# Patient Record
Sex: Male | Born: 1985
Health system: Southern US, Community
[De-identification: ages and names within clinical notes are randomized; demographics above are authoritative.]

## PROBLEM LIST (undated history)

## (undated) DIAGNOSIS — F411 Generalized anxiety disorder: Secondary | ICD-10-CM

## (undated) DIAGNOSIS — E785 Hyperlipidemia, unspecified: Secondary | ICD-10-CM

## (undated) DIAGNOSIS — E781 Pure hyperglyceridemia: Secondary | ICD-10-CM

## (undated) DIAGNOSIS — Z9189 Other specified personal risk factors, not elsewhere classified: Secondary | ICD-10-CM

## (undated) DIAGNOSIS — F319 Bipolar disorder, unspecified: Secondary | ICD-10-CM

## (undated) DIAGNOSIS — IMO0001 Reserved for inherently not codable concepts without codable children: Secondary | ICD-10-CM

## (undated) DIAGNOSIS — F329 Major depressive disorder, single episode, unspecified: Secondary | ICD-10-CM

## (undated) DIAGNOSIS — M4120 Other idiopathic scoliosis, site unspecified: Secondary | ICD-10-CM

## (undated) HISTORY — DX: Other specified personal risk factors, not elsewhere classified: Z91.89

## (undated) HISTORY — DX: Hyperlipidemia, unspecified: E78.5

## (undated) HISTORY — DX: Other idiopathic scoliosis, site unspecified: M41.20

## (undated) HISTORY — DX: Pure hyperglyceridemia: E78.1

## (undated) HISTORY — DX: Reserved for inherently not codable concepts without codable children: IMO0001

## (undated) HISTORY — DX: Generalized anxiety disorder: F41.1

## (undated) HISTORY — PX: WISDOM TOOTH EXTRACTION: SHX21

## (undated) HISTORY — DX: Major depressive disorder, single episode, unspecified: F32.9

---
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
## 2005-05-27 ENCOUNTER — Emergency Department (HOSPITAL_COMMUNITY): Admission: EM | Admit: 2005-05-27 | Discharge: 2005-05-27 | Payer: Self-pay | Admitting: Emergency Medicine

## 2005-08-29 ENCOUNTER — Ambulatory Visit: Payer: Self-pay | Admitting: Internal Medicine

## 2005-09-01 ENCOUNTER — Ambulatory Visit: Payer: Self-pay | Admitting: Internal Medicine

## 2005-10-04 ENCOUNTER — Ambulatory Visit: Payer: Self-pay | Admitting: Internal Medicine

## 2005-10-05 ENCOUNTER — Ambulatory Visit: Payer: Self-pay | Admitting: Internal Medicine

## 2005-11-07 ENCOUNTER — Ambulatory Visit (HOSPITAL_COMMUNITY): Admission: RE | Admit: 2005-11-07 | Discharge: 2005-11-07 | Payer: Self-pay | Admitting: Orthopedic Surgery

## 2005-11-24 ENCOUNTER — Encounter: Admission: RE | Admit: 2005-11-24 | Discharge: 2005-11-24 | Payer: Self-pay | Admitting: Orthopedic Surgery

## 2006-08-22 ENCOUNTER — Emergency Department (HOSPITAL_COMMUNITY): Admission: EM | Admit: 2006-08-22 | Discharge: 2006-08-23 | Payer: Self-pay | Admitting: *Deleted

## 2007-02-16 ENCOUNTER — Encounter: Payer: Self-pay | Admitting: *Deleted

## 2007-02-16 DIAGNOSIS — Z9189 Other specified personal risk factors, not elsewhere classified: Secondary | ICD-10-CM | POA: Insufficient documentation

## 2007-02-16 DIAGNOSIS — M4120 Other idiopathic scoliosis, site unspecified: Secondary | ICD-10-CM | POA: Insufficient documentation

## 2007-02-16 HISTORY — DX: Other specified personal risk factors, not elsewhere classified: Z91.89

## 2007-02-16 HISTORY — DX: Other idiopathic scoliosis, site unspecified: M41.20

## 2007-12-27 ENCOUNTER — Encounter: Payer: Self-pay | Admitting: Internal Medicine

## 2008-08-05 ENCOUNTER — Encounter: Payer: Self-pay | Admitting: Internal Medicine

## 2009-02-20 ENCOUNTER — Encounter: Payer: Self-pay | Admitting: Internal Medicine

## 2010-04-13 ENCOUNTER — Encounter: Payer: Self-pay | Admitting: Internal Medicine

## 2010-06-02 ENCOUNTER — Ambulatory Visit: Admit: 2010-06-02 | Payer: Self-pay | Admitting: Internal Medicine

## 2010-06-10 NOTE — Letter (Signed)
Summary: [facility]  [facility]   Imported By: Sherian Rein 04/26/2010 11:44:45  _____________________________________________________________________  External Attachment:    Type:   Image     Comment:   External Document

## 2010-06-11 ENCOUNTER — Encounter (INDEPENDENT_AMBULATORY_CARE_PROVIDER_SITE_OTHER): Payer: Self-pay | Admitting: *Deleted

## 2010-06-11 ENCOUNTER — Other Ambulatory Visit: Payer: Self-pay | Admitting: Internal Medicine

## 2010-06-11 ENCOUNTER — Other Ambulatory Visit: Payer: Self-pay

## 2010-06-11 DIAGNOSIS — Z0000 Encounter for general adult medical examination without abnormal findings: Secondary | ICD-10-CM

## 2010-06-11 DIAGNOSIS — E785 Hyperlipidemia, unspecified: Secondary | ICD-10-CM

## 2010-06-11 LAB — URINALYSIS, ROUTINE W REFLEX MICROSCOPIC
Leukocytes, UA: NEGATIVE
Urine Glucose: NEGATIVE
Urobilinogen, UA: 0.2 (ref 0.0–1.0)

## 2010-06-11 LAB — LIPID PANEL
Cholesterol: 236 mg/dL — ABNORMAL HIGH (ref 0–200)
HDL: 31.2 mg/dL — ABNORMAL LOW (ref >39.00)
Total CHOL/HDL Ratio: 8
Triglycerides: 627 mg/dL — ABNORMAL HIGH (ref 0.0–149.0)
VLDL: 125.4 mg/dL — ABNORMAL HIGH (ref 0.0–40.0)

## 2010-06-11 LAB — HEPATIC FUNCTION PANEL
Albumin: 3.9 g/dL (ref 3.5–5.2)
Alkaline Phosphatase: 72 U/L (ref 39–117)
Total Protein: 6.6 g/dL (ref 6.0–8.3)

## 2010-06-11 LAB — CBC WITH DIFFERENTIAL/PLATELET
Basophils Absolute: 0 10*3/uL (ref 0.0–0.1)
Basophils Relative: 0.4 % (ref 0.0–3.0)
Eosinophils Absolute: 0.2 10*3/uL (ref 0.0–0.7)
HCT: 41.2 % (ref 39.0–52.0)
Hemoglobin: 14.4 g/dL (ref 13.0–17.0)
Lymphocytes Relative: 32.8 % (ref 12.0–46.0)
Lymphs Abs: 3.4 10*3/uL (ref 0.7–4.0)
MCHC: 34.8 g/dL (ref 30.0–36.0)
MCV: 90.9 fl (ref 78.0–100.0)
Monocytes Absolute: 0.8 10*3/uL (ref 0.1–1.0)
Neutro Abs: 5.9 10*3/uL (ref 1.4–7.7)
RBC: 4.54 Mil/uL (ref 4.22–5.81)
RDW: 12.7 % (ref 11.5–14.6)

## 2010-06-11 LAB — BASIC METABOLIC PANEL
BUN: 14 mg/dL (ref 6–23)
CO2: 31 mEq/L (ref 19–32)
Calcium: 9.4 mg/dL (ref 8.4–10.5)
Chloride: 101 mEq/L (ref 96–112)
Creatinine, Ser: 0.8 mg/dL (ref 0.4–1.5)
GFR: 124.79 mL/min (ref >60.00)
Glucose, Bld: 81 mg/dL (ref 70–99)
Potassium: 4.4 mEq/L (ref 3.5–5.1)
Sodium: 139 mEq/L (ref 135–145)

## 2010-06-11 LAB — TSH: TSH: 0.5 u[IU]/mL (ref 0.35–5.50)

## 2010-06-11 LAB — LDL CHOLESTEROL, DIRECT: Direct LDL: 127.4 mg/dL

## 2010-06-16 ENCOUNTER — Encounter (INDEPENDENT_AMBULATORY_CARE_PROVIDER_SITE_OTHER): Payer: Commercial Managed Care - PPO | Admitting: Internal Medicine

## 2010-06-16 ENCOUNTER — Encounter: Payer: Self-pay | Admitting: Internal Medicine

## 2010-06-16 DIAGNOSIS — E785 Hyperlipidemia, unspecified: Secondary | ICD-10-CM | POA: Insufficient documentation

## 2010-06-16 DIAGNOSIS — F411 Generalized anxiety disorder: Secondary | ICD-10-CM | POA: Insufficient documentation

## 2010-06-16 DIAGNOSIS — IMO0001 Reserved for inherently not codable concepts without codable children: Secondary | ICD-10-CM

## 2010-06-16 DIAGNOSIS — E781 Pure hyperglyceridemia: Secondary | ICD-10-CM

## 2010-06-16 DIAGNOSIS — F329 Major depressive disorder, single episode, unspecified: Secondary | ICD-10-CM | POA: Insufficient documentation

## 2010-06-16 DIAGNOSIS — F3289 Other specified depressive episodes: Secondary | ICD-10-CM

## 2010-06-16 DIAGNOSIS — Z23 Encounter for immunization: Secondary | ICD-10-CM

## 2010-06-16 DIAGNOSIS — Z0000 Encounter for general adult medical examination without abnormal findings: Secondary | ICD-10-CM

## 2010-06-16 HISTORY — DX: Generalized anxiety disorder: F41.1

## 2010-06-16 HISTORY — DX: Major depressive disorder, single episode, unspecified: F32.9

## 2010-06-16 HISTORY — DX: Reserved for inherently not codable concepts without codable children: IMO0001

## 2010-06-16 HISTORY — DX: Pure hyperglyceridemia: E78.1

## 2010-06-16 HISTORY — DX: Hyperlipidemia, unspecified: E78.5

## 2010-06-16 HISTORY — DX: Other specified depressive episodes: F32.89

## 2010-06-24 NOTE — Assessment & Plan Note (Signed)
Summary: CPX/BCBS/#CD   Vital Signs:  Patient profile:   25 year old male Height:      67 inches Weight:      226.13 pounds BMI:     35.54 O2 Sat:      94 % on Room air Temp:     98.5 degrees F oral Pulse rate:   113 / minute BP sitting:   112 / 90  (left arm) Cuff size:   large  Vitals Entered By: Zella Ball Ewing CMA (AAMA) (June 16, 2010 10:37 AM)  O2 Flow:  Room air  CC: Adult Physical/RE   CC:  Adult Physical/RE.  History of Present Illness: here for wellness and f/u - overall doing ok - Pt denies CP, worsening sob, doe, wheezing, orthopnea, pnd, worsening LE edema, palps, dizziness or syncope  Pt denies new neuro symptoms such as headache, facial or extremity weakness  Pt denies polydipsia, polyuria,   Overall good compliance with meds, trying to follow low chol diet, wt increased over 20 lbs, goes to gym 3 -4 days per wk but hard to lose wt.  No fever, wt loss, night sweats, loss of appetite or other constitutional symptoms  Overall good compliance with meds, and good tolerability.  Denies worsening depressive symptoms, suicidal ideation, or panic, though has ongoing significant anxiety.  Pt states good ability with ADL's, low fall risk, home safety reviewed and adequate, no significant change in hearing or vision, trying to follow lower chol diet, and occasionally active only with regular excercise.    Preventive Screening-Counseling & Management  Alcohol-Tobacco     Smoking Status: quit      Drug Use:  no.    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 1 By Mouth Once Daily 2)  Flexeril 10 Mg Tabs (Cyclobenzaprine Hcl) .Marland Kitchen.. 1 By Mouth Once Daily 3)  Ambien 10 Mg Tabs (Zolpidem Tartrate) .Marland Kitchen..  1 By Mouth At Bedtime 4)  Xanax 0.5 Mg Tabs (Alprazolam) .Marland Kitchen.. 1 By Mouth As Needed 5)  Robaxin 500 Mg Tabs (Methocarbamol) .Marland Kitchen.. 1 By Mouth Once Daily As Needed 6)  Prilosec 20 Mg Cpdr (Omeprazole) .Marland Kitchen.. 1 By Mouth Once Daily 7)  Multivitamins  Caps (Multiple Vitamin) .Marland Kitchen.. 1 By Mouth Once Daily 8)  Vitamin C 1000 Mg Tabs (Ascorbic Acid) .Marland Kitchen.. 1 By Mouth Once Daily 9)  Calcium-Vitamin D 600-200 Mg-Unit Tabs (Calcium-Vitamin D) .Marland Kitchen.. 1 By Mouth Once Daily 10)  Claritin 10 Mg Tabs (Loratadine) .Marland Kitchen.. 1 By Mouth Once Daily 11)  Fish Oil 1000 Mg Caps (Omega-3 Fatty Acids) .Marland Kitchen.. 1 By Mouth Once Daily 12)  Fenofibrate 160 Mg Tabs (Fenofibrate) .Marland Kitchen.. 1po Once Daily  Allergies (verified): No Known Drug Allergies  Past History:  Family History: Last updated: 06/16/2010 HTN Anxiety  Social History: Last updated: 06/16/2010 Single Former Smoker Alcohol use-no Drug use-no  Risk Factors: Smoking Status: quit (06/16/2010)  Past Medical History: WISDOM TEETH EXTRACTION, HX OF (ICD-V15.9) SCOLIOSIS, MILD (ICD-737.30 FMS Anxiety Depression Hyperlipidemia  Past Surgical History: Denies surgical history  Family History: Reviewed history and no changes required. HTN Anxiety  Social History: Reviewed history and no changes required. Single Former Smoker Alcohol use-no Drug use-no  Smoking Status:  quit Drug  Use:  no  Review of Systems  The patient denies anorexia, fever, weight loss, vision loss, decreased hearing, hoarseness, chest pain, syncope, dyspnea on exertion, peripheral edema, prolonged cough, headaches, hemoptysis, abdominal pain, melena, hematochezia, severe indigestion/heartburn, hematuria, muscle weakness, suspicious skin lesions, transient blindness, difficulty walking, depression, unusual weight change, abnormal bleeding, enlarged lymph nodes, and angioedema.         all otherwise negative per pt -    Physical Exam  General:  alert and overweight-appearing.   Head:   normocephalic and atraumatic.   Eyes:  vision grossly intact, pupils equal, and pupils round.   Ears:  R ear normal and L ear normal.   Nose:  no external deformity and no nasal discharge.   Mouth:  no gingival abnormalities and pharynx pink and moist.   Neck:  supple and no masses.   Lungs:  normal respiratory effort and normal breath sounds.   Heart:  normal rate and regular rhythm.   Abdomen:  soft, non-tender, and normal bowel sounds.   Msk:  no joint tenderness and no joint swelling.   Extremities:  no edema, no erythema  Neurologic:  cranial nerves II-XII intact and strength normal in all extremities.   Skin:  color normal and no rashes.   Psych:  normally interactive, not depressed appearing, and moderately anxious.     Impression & Recommendations:  Problem # 1:  Preventive Health Care (ICD-V70.0) Overall doing well, age appropriate education and counseling updated, referral for preventive services and immunizations addressed, dietary counseling and smoking status adressed , most recent labs reviewed I have personally reviewed and have noted 1.The patient's medical and social history 2.Their use of alcohol, tobacco or illicit drugs 3.Their current medications and supplements 4. Functional ability including ADL's, fall risk, home safety risk, hearing & visual impairment  5.Diet and physical activities 6.Evidence for depression or mood disorders The patients weight, height, BMI  have been recorded in the chart I have made referrals, counseling and provided education to the patient based review of the above   Problem # 2:  HYPERTRIGLYCERIDEMIA (ICD-272.1)  His updated medication list for this problem includes:    Fenofibrate 160 Mg Tabs (Fenofibrate) .Marland Kitchen... 1po once daily treat as above, f/u any worsening signs or symptoms   Complete Medication List: 1)  Cymbalta 60 Mg Cpep (Duloxetine hcl) .Marland Kitchen.. 1 by mouth once daily 2)  Flexeril 10 Mg Tabs (Cyclobenzaprine hcl) .Marland Kitchen.. 1 by  mouth once daily 3)  Ambien 10 Mg Tabs (Zolpidem tartrate) .Marland Kitchen.. 1 by mouth at bedtime 4)  Xanax 0.5 Mg Tabs (Alprazolam) .Marland Kitchen.. 1 by mouth as needed 5)  Robaxin 500 Mg Tabs (Methocarbamol) .Marland Kitchen.. 1 by mouth once daily as needed 6)  Prilosec 20 Mg Cpdr (Omeprazole) .Marland Kitchen.. 1 by mouth once daily 7)  Multivitamins Caps (Multiple vitamin) .Marland Kitchen.. 1 by mouth once daily 8)  Vitamin C 1000 Mg Tabs (Ascorbic acid) .Marland Kitchen.. 1 by mouth once daily 9)  Calcium-vitamin D 600-200 Mg-unit Tabs (Calcium-vitamin d) .Marland Kitchen.. 1 by mouth once daily 10)  Claritin 10 Mg Tabs (Loratadine) .Marland Kitchen.. 1 by mouth once daily 11)  Fish Oil 1000 Mg Caps (Omega-3 fatty acids) .Marland Kitchen.. 1 by mouth once daily 12)  Fenofibrate 160 Mg Tabs (Fenofibrate) .Marland Kitchen.. 1po once daily  Other Orders: Tdap => 17yrs IM (16109) Admin 1st Vaccine (60454)  Patient Instructions: 1)  start fenofibrate 160 mg per day 2)  you had the tetanus shot today 3)  Please follow lower cholesterol, lower fat diet,  and get regular excercise 4)  Please schedule a follow-up appointment in 1 year, or sooner if needed Prescriptions: FENOFIBRATE 160 MG TABS (FENOFIBRATE) 1po once daily  #90 x 3   Entered and Authorized by:   Corwin Levins MD   Signed by:   Corwin Levins MD on 06/16/2010   Method used:   Print then Give to Patient   RxID:   [id number]    Orders Added: 1)  Tdap => 91yrs IM [[id number]] 2)  Admin 1st Vaccine [90471] 3)  Est. Patient 18-39 years [46962]   Immunizations Administered:  Tetanus Vaccine:    Vaccine Type: Tdap    Site: left deltoid    Mfr: GlaxoSmithKline    Dose: 0.5 ml    Route: IM    Given by: Zella Ball Ewing CMA (AAMA)    Exp. Date: 02/26/2012    Lot #: [id number]    VIS given: 03/26/08 version given June 16, 2010.   Immunizations Administered:  Tetanus Vaccine:    Vaccine Type: Tdap    Site: left deltoid    Mfr: GlaxoSmithKline    Dose: 0.5 ml    Route: IM    Given by: Zella Ball Ewing CMA (AAMA)    Exp. Date: 02/26/2012    Lot #:  [id number]    VIS given: 03/26/08 version given June 16, 2010.

## 2011-06-06 ENCOUNTER — Telehealth: Payer: Self-pay

## 2011-06-06 DIAGNOSIS — Z0000 Encounter for general adult medical examination without abnormal findings: Secondary | ICD-10-CM

## 2011-06-06 NOTE — Telephone Encounter (Signed)
Put order in for physical labs. 

## 2011-06-20 ENCOUNTER — Other Ambulatory Visit: Payer: Self-pay | Admitting: Internal Medicine

## 2011-07-07 ENCOUNTER — Other Ambulatory Visit (INDEPENDENT_AMBULATORY_CARE_PROVIDER_SITE_OTHER): Payer: Commercial Managed Care - PPO

## 2011-07-07 DIAGNOSIS — Z0000 Encounter for general adult medical examination without abnormal findings: Secondary | ICD-10-CM

## 2011-07-07 LAB — CBC WITH DIFFERENTIAL/PLATELET
Basophils Relative: 0.5 % (ref 0.0–3.0)
Eosinophils Absolute: 0.1 10*3/uL (ref 0.0–0.7)
Eosinophils Relative: 1.8 % (ref 0.0–5.0)
Hemoglobin: 14.2 g/dL (ref 13.0–17.0)
Lymphocytes Relative: 33.4 % (ref 12.0–46.0)
MCHC: 33.8 g/dL (ref 30.0–36.0)
MCV: 92 fl (ref 78.0–100.0)
Monocytes Absolute: 0.6 10*3/uL (ref 0.1–1.0)
Neutro Abs: 4.3 10*3/uL (ref 1.4–7.7)
Neutrophils Relative %: 56.2 % (ref 43.0–77.0)
RBC: 4.57 Mil/uL (ref 4.22–5.81)
WBC: 7.7 10*3/uL (ref 4.5–10.5)

## 2011-07-07 LAB — BASIC METABOLIC PANEL
BUN: 16 mg/dL (ref 6–23)
Chloride: 104 mEq/L (ref 96–112)
Creatinine, Ser: 0.9 mg/dL (ref 0.4–1.5)
Glucose, Bld: 116 mg/dL — ABNORMAL HIGH (ref 70–99)
Potassium: 3.7 mEq/L (ref 3.5–5.1)

## 2011-07-07 LAB — URINALYSIS, ROUTINE W REFLEX MICROSCOPIC
Bilirubin Urine: NEGATIVE
Ketones, ur: NEGATIVE
Leukocytes, UA: NEGATIVE
Nitrite: NEGATIVE
Urobilinogen, UA: 0.2 (ref 0.0–1.0)

## 2011-07-07 LAB — HEPATIC FUNCTION PANEL
ALT: 38 U/L (ref 0–53)
AST: 24 U/L (ref 0–37)
Albumin: 4.6 g/dL (ref 3.5–5.2)
Total Bilirubin: 0.4 mg/dL (ref 0.3–1.2)

## 2011-07-07 LAB — LDL CHOLESTEROL, DIRECT: Direct LDL: 162.5 mg/dL

## 2011-07-07 LAB — LIPID PANEL
Total CHOL/HDL Ratio: 5
Triglycerides: 136 mg/dL (ref 0.0–149.0)

## 2011-07-07 LAB — TSH: TSH: 0.42 u[IU]/mL (ref 0.35–5.50)

## 2011-07-08 ENCOUNTER — Encounter: Payer: Self-pay | Admitting: Internal Medicine

## 2011-07-08 DIAGNOSIS — Z139 Encounter for screening, unspecified: Secondary | ICD-10-CM | POA: Insufficient documentation

## 2011-07-08 DIAGNOSIS — Z0000 Encounter for general adult medical examination without abnormal findings: Secondary | ICD-10-CM | POA: Insufficient documentation

## 2011-07-08 LAB — HEMOGLOBIN A1C: Hgb A1c MFr Bld: 5.6 % (ref 4.6–6.5)

## 2011-07-14 ENCOUNTER — Ambulatory Visit (INDEPENDENT_AMBULATORY_CARE_PROVIDER_SITE_OTHER): Payer: Commercial Managed Care - PPO | Admitting: Internal Medicine

## 2011-07-14 ENCOUNTER — Encounter: Payer: Self-pay | Admitting: Internal Medicine

## 2011-07-14 VITALS — BP 110/82 | HR 110 | Temp 98.4°F | Ht 67.0 in | Wt 219.5 lb

## 2011-07-14 DIAGNOSIS — R7302 Impaired glucose tolerance (oral): Secondary | ICD-10-CM | POA: Insufficient documentation

## 2011-07-14 DIAGNOSIS — R7309 Other abnormal glucose: Secondary | ICD-10-CM

## 2011-07-14 DIAGNOSIS — Z0000 Encounter for general adult medical examination without abnormal findings: Secondary | ICD-10-CM

## 2011-07-14 HISTORY — DX: Impaired glucose tolerance (oral): R73.02

## 2011-07-14 NOTE — Progress Notes (Signed)
Subjective:    Patient ID: Corey Smith, male    DOB: 1986/01/25, 26 y.o.   MRN: [id number]  HPI  Here for wellness and f/u;  Overall doing ok;  Pt denies CP, worsening SOB, DOE, wheezing, orthopnea, PND, worsening LE edema, palpitations, dizziness or syncope.  Pt denies neurological change such as new Headache, facial or extremity weakness.  Pt denies polydipsia, polyuria, or low sugar symptoms. Pt states overall good compliance with treatment and medications, good tolerability, and trying to follow lower cholesterol diet.  Pt denies worsening depressive symptoms, suicidal ideation or panic. No fever, wt loss, night sweats, loss of appetite, or other constitutional symptoms.  Pt states good ability with ADL's, low fall risk, home safety reviewed and adequate, no significant changes in hearing or vision, and occasionally active with exercise.  Works as Public house manager for [employer]  Cont's to see dr Devweschwar/rheum for his FMS Past Medical History  Diagnosis Date  . ANXIETY 06/16/2010  . DEPRESSION 06/16/2010  . HYPERLIPIDEMIA 06/16/2010  . FIBROMYALGIA 06/16/2010  . HYPERTRIGLYCERIDEMIA 06/16/2010  . SCOLIOSIS, MILD 02/16/2007  . WISDOM TEETH EXTRACTION, HX OF 02/16/2007   No past surgical history on file.  reports that he has quit smoking. He does not have any smokeless tobacco history on file. He reports that he does not drink alcohol or use illicit drugs. family history includes Anxiety disorder in his other and Hypertension in his other. No Known Allergies Current Outpatient Prescriptions on File Prior to Visit  Medication Sig Dispense Refill  . ALPRAZolam (XANAX) 0.5 MG tablet Take 0.5 mg by mouth at bedtime as needed.      . Ascorbic Acid (VITAMIN C) 1000 MG tablet Take 1,000 mg by mouth daily.      . Calcium Carbonate-Vitamin D (CALCIUM 600+D) 600-200 MG-UNIT TABS Take 1 tablet by mouth daily.      . cyclobenzaprine (FLEXERIL) 10 MG tablet Take 10 mg by mouth daily.      . DULoxetine (CYMBALTA) 60 MG  capsule Take 60 mg by mouth daily.      . fenofibrate 160 MG tablet TAKE 1 TABLET BY MOUTH ONCE DAILY  90 tablet  1  . loratadine (CLARITIN) 10 MG tablet Take 10 mg by mouth daily.      . methocarbamol (ROBAXIN) 500 MG tablet Take 500 mg by mouth daily as needed.      . Multiple Vitamin (MULTIVITAMIN) capsule Take 1 capsule by mouth daily.      Marland Kitchen omeprazole (PRILOSEC) 20 MG capsule Take 20 mg by mouth daily.      Marland Kitchen zolpidem (AMBIEN) 10 MG tablet Take 10 mg by mouth at bedtime.      . fish oil-omega-3 fatty acids 1000 MG capsule Take 1 capsule by mouth daily.         Review of Systems Review of Systems  Constitutional: Negative for diaphoresis, activity change, appetite change and unexpected weight change.  HENT: Negative for hearing loss, ear pain, facial swelling, mouth sores and neck stiffness.   Eyes: Negative for pain, redness and visual disturbance.  Respiratory: Negative for shortness of breath and wheezing.   Cardiovascular: Negative for chest pain and palpitations.  Gastrointestinal: Negative for diarrhea, blood in stool, abdominal distention and rectal pain.  Genitourinary: Negative for hematuria, flank pain and decreased urine volume.  Musculoskeletal: Negative for myalgias and joint swelling.  Skin: Negative for color change and wound.  Neurological: Negative for syncope and numbness.  Hematological: Negative for adenopathy.  Psychiatric/Behavioral: Negative  for hallucinations, self-injury, decreased concentration and agitation.      Objective:   Physical Exam BP 110/82  Pulse 110  Temp(Src) 98.4 F (36.9 C) (Oral)  Ht 5\' 7"  (1.702 m)  Wt 219 lb 8 oz (99.565 kg)  BMI 34.38 kg/m2  SpO2 97% Physical Exam  VS noted Constitutional: Pt is oriented to person, place, and time. Appears well-developed and well-nourished.  HENT:  Head: Normocephalic and atraumatic.  Right Ear: External ear normal.  Left Ear: External ear normal.  Nose: Nose normal.  Mouth/Throat:  Oropharynx is clear and moist.  Eyes: Conjunctivae and EOM are normal. Pupils are equal, round, and reactive to light.  Neck: Normal range of motion. Neck supple. No JVD present. No tracheal deviation present.  Cardiovascular: Normal rate, regular rhythm, normal heart sounds and intact distal pulses.   Pulmonary/Chest: Effort normal and breath sounds normal.  Abdominal: Soft. Bowel sounds are normal. There is no tenderness.  Musculoskeletal: Normal range of motion. Exhibits no edema.  Lymphadenopathy:  Has no cervical adenopathy.  Neurological: Pt is alert and oriented to person, place, and time. Pt has normal reflexes. No cranial nerve deficit.  Skin: Skin is warm and dry. No rash noted.  Psychiatric:  Has  normal mood and affect. Behavior is normal.     Assessment & Plan:

## 2011-07-14 NOTE — Patient Instructions (Signed)
Continue all other medications as before Please have the pharmacy call if you need refills Please keep your appointments with your specialists as you have planned - Dr Link Snuffer Please focus on more activity and weight loss if possible Please return in 1 year for your yearly visit, or sooner if needed, with Lab testing done 3-5 days before

## 2011-07-17 ENCOUNTER — Encounter: Payer: Self-pay | Admitting: Internal Medicine

## 2011-07-17 NOTE — Assessment & Plan Note (Signed)

## 2011-07-17 NOTE — Assessment & Plan Note (Signed)
Asymtp, for f/u a1c next visit Lab Results  Component Value Date   HGBA1C 5.6 07/07/2011

## 2012-04-10 ENCOUNTER — Other Ambulatory Visit: Payer: Self-pay | Admitting: Internal Medicine

## 2012-07-11 ENCOUNTER — Other Ambulatory Visit (INDEPENDENT_AMBULATORY_CARE_PROVIDER_SITE_OTHER): Payer: Commercial Managed Care - PPO

## 2012-07-11 DIAGNOSIS — Z0000 Encounter for general adult medical examination without abnormal findings: Secondary | ICD-10-CM

## 2012-07-11 LAB — URINALYSIS, ROUTINE W REFLEX MICROSCOPIC
Bilirubin Urine: NEGATIVE
Leukocytes, UA: NEGATIVE
Nitrite: NEGATIVE
Specific Gravity, Urine: 1.015 (ref 1.000–1.030)
Total Protein, Urine: NEGATIVE
pH: 6.5 (ref 5.0–8.0)

## 2012-07-11 LAB — HEPATIC FUNCTION PANEL
ALT: 27 U/L (ref 0–53)
Albumin: 4.4 g/dL (ref 3.5–5.2)
Bilirubin, Direct: 0.1 mg/dL (ref 0.0–0.3)
Total Protein: 7.6 g/dL (ref 6.0–8.3)

## 2012-07-11 LAB — LIPID PANEL
Cholesterol: 203 mg/dL — ABNORMAL HIGH (ref 0–200)
VLDL: 26.2 mg/dL (ref 0.0–40.0)

## 2012-07-11 LAB — BASIC METABOLIC PANEL
BUN: 17 mg/dL (ref 6–23)
GFR: 110.02 mL/min (ref >60.00)
Potassium: 4.1 mEq/L (ref 3.5–5.1)
Sodium: 137 mEq/L (ref 135–145)

## 2012-07-11 LAB — TSH: TSH: 0.61 u[IU]/mL (ref 0.35–5.50)

## 2012-07-11 LAB — CBC WITH DIFFERENTIAL/PLATELET
Basophils Relative: 0.3 % (ref 0.0–3.0)
Eosinophils Relative: 2.4 % (ref 0.0–5.0)
Lymphocytes Relative: 24.4 % (ref 12.0–46.0)
Neutrophils Relative %: 65.3 % (ref 43.0–77.0)
Platelets: 354 10*3/uL (ref 150.0–400.0)
RBC: 4.57 Mil/uL (ref 4.22–5.81)
WBC: 10.5 10*3/uL (ref 4.5–10.5)

## 2012-07-16 ENCOUNTER — Encounter: Payer: Self-pay | Admitting: Internal Medicine

## 2012-07-16 ENCOUNTER — Ambulatory Visit (INDEPENDENT_AMBULATORY_CARE_PROVIDER_SITE_OTHER): Payer: Commercial Managed Care - PPO | Admitting: Internal Medicine

## 2012-07-16 VITALS — BP 102/78 | HR 110 | Temp 98.2°F | Ht 66.0 in | Wt 211.2 lb

## 2012-07-16 DIAGNOSIS — R7309 Other abnormal glucose: Secondary | ICD-10-CM

## 2012-07-16 DIAGNOSIS — Z0000 Encounter for general adult medical examination without abnormal findings: Secondary | ICD-10-CM

## 2012-07-16 DIAGNOSIS — R7302 Impaired glucose tolerance (oral): Secondary | ICD-10-CM

## 2012-07-16 DIAGNOSIS — M7660 Achilles tendinitis, unspecified leg: Secondary | ICD-10-CM | POA: Insufficient documentation

## 2012-07-16 DIAGNOSIS — M7662 Achilles tendinitis, left leg: Secondary | ICD-10-CM

## 2012-07-16 MED ORDER — FENOFIBRATE 160 MG PO TABS: ORAL_TABLET | ORAL | Status: DC

## 2012-07-16 NOTE — Assessment & Plan Note (Signed)
Mild, for alleve prn, to f/u any worsening symptoms or concerns

## 2012-07-16 NOTE — Assessment & Plan Note (Signed)
stable overall by history and exam, recent data reviewed with pt, and pt to continue medical treatment as before,  to f/u any worsening symptoms or concerns Lab Results  Component Value Date   HGBA1C 5.7 07/11/2012

## 2012-07-16 NOTE — Progress Notes (Signed)
Subjective:    Patient ID: Corey Smith, male    DOB: 1985/09/28, 28 y.o.   MRN: [id number]  HPI  Here for wellness and f/u;  Overall doing ok;  Pt denies CP, worsening SOB, DOE, wheezing, orthopnea, PND, worsening LE edema, palpitations, dizziness or syncope.  Pt denies neurological change such as new headache, facial or extremity weakness.  Pt denies polydipsia, polyuria, or low sugar symptoms. Pt states overall good compliance with treatment and medications, good tolerability, and has been trying to follow lower cholesterol diet.  Pt denies worsening depressive symptoms, suicidal ideation or panic. No fever, night sweats, wt loss, loss of appetite, or other constitutional symptoms.  Pt states good ability with ADL's, has low fall risk, home safety reviewed and adequate, no other significant changes in hearing or vision, and only occasionally active with exercise.  Does have a minor firm swelling area near the left achilles insertion site with mild tenderness Past Medical History  Diagnosis Date  . ANXIETY 06/16/2010  . DEPRESSION 06/16/2010  . HYPERLIPIDEMIA 06/16/2010  . FIBROMYALGIA 06/16/2010  . HYPERTRIGLYCERIDEMIA 06/16/2010  . SCOLIOSIS, MILD 02/16/2007  . WISDOM TEETH EXTRACTION, HX OF 02/16/2007   No past surgical history on file.  reports that he has quit smoking. He does not have any smokeless tobacco history on file. He reports that he does not drink alcohol or use illicit drugs. family history includes Anxiety disorder in his other and Hypertension in his other. No Known Allergies Current Outpatient Prescriptions on File Prior to Visit  Medication Sig Dispense Refill  . ALPRAZolam (XANAX) 0.5 MG tablet Take 0.5 mg by mouth at bedtime as needed.      . Calcium Carbonate-Vitamin D (CALCIUM 600+D) 600-200 MG-UNIT TABS Take 1 tablet by mouth daily.      . cyclobenzaprine (FLEXERIL) 10 MG tablet Take 10 mg by mouth daily.      . DULoxetine (CYMBALTA) 60 MG capsule Take 60 mg by mouth daily.       Marland Kitchen loratadine (CLARITIN) 10 MG tablet Take 10 mg by mouth daily.      . methocarbamol (ROBAXIN) 500 MG tablet Take 500 mg by mouth daily as needed.      . Multiple Vitamin (MULTIVITAMIN) capsule Take 1 capsule by mouth daily.      Marland Kitchen omeprazole (PRILOSEC) 20 MG capsule Take 20 mg by mouth daily.      Marland Kitchen zolpidem (AMBIEN) 10 MG tablet Take 10 mg by mouth at bedtime.       No current facility-administered medications on file prior to visit.   Review of Systems Constitutional: Negative for diaphoresis, activity change, appetite change or unexpected weight change.  HENT: Negative for hearing loss, ear pain, facial swelling, mouth sores and neck stiffness.   Eyes: Negative for pain, redness and visual disturbance.  Respiratory: Negative for shortness of breath and wheezing.   Cardiovascular: Negative for chest pain and palpitations.  Gastrointestinal: Negative for diarrhea, blood in stool, abdominal distention or other pain Genitourinary: Negative for hematuria, flank pain or change in urine volume.  Musculoskeletal: Negative for myalgias and joint swelling.  Skin: Negative for color change and wound.  Neurological: Negative for syncope and numbness. other than noted Hematological: Negative for adenopathy.  Psychiatric/Behavioral: Negative for hallucinations, self-injury, decreased concentration and agitation.      Objective:   Physical Exam BP 102/78  Pulse 110  Temp(Src) 98.2 F (36.8 C) (Oral)  Ht 5\' 6"  (1.676 m)  Wt 211 lb 4 oz (95.822 kg)  BMI 34.11 kg/m2  SpO2 93% VS noted,  Constitutional: Pt is oriented to person, place, and time. Appears well-developed and well-nourished.  Head: Normocephalic and atraumatic.  Right Ear: External ear normal.  Left Ear: External ear normal.  Nose: Nose normal.  Mouth/Throat: Oropharynx is clear and moist.  Eyes: Conjunctivae and EOM are normal. Pupils are equal, round, and reactive to light.  Neck: Normal range of motion. Neck supple. No  JVD present. No tracheal deviation present.  Cardiovascular: Normal rate, regular rhythm, normal heart sounds and intact distal pulses.   Pulmonary/Chest: Effort normal and breath sounds normal.  Abdominal: Soft. Bowel sounds are normal. There is no tenderness. No HSM  Musculoskeletal: Normal range of motion. Exhibits no edema.  Lymphadenopathy:  Has no cervical adenopathy.  Neurological: Pt is alert and oriented to person, place, and time. Pt has normal reflexes. No cranial nerve deficit.  Skin: Skin is warm and dry. No rash noted.  Psychiatric:  Has  normal mood and affect. Behavior is normal.  Left achilles with small tender nodular swelling near the left insertion site, mild tender       Assessment & Plan:

## 2012-07-16 NOTE — Patient Instructions (Addendum)
Please continue all other medications as before, and refills have been done if requested - the fenofibrate Please have the pharmacy call with any other refills you may need. Please continue your efforts at being more active, low cholesterol diet, and weight control. You are otherwise up to date with prevention measures today. Thank you for enrolling in MyChart. Please follow the instructions below to securely access your online medical record. MyChart allows you to send messages to your doctor, view your test results, renew your prescriptions, schedule appointments, and more To Log into My Chart online, please go by [facility] or [employer] to [email or web address], or download the MyChart App from the [employer] .  Your Username is: [email or web address] (pass andrea) Please send a practice Message on Mychart later today. Please return in 1 year for your yearly visit, or sooner if needed, with Lab testing done 3-5 days before

## 2012-07-16 NOTE — Assessment & Plan Note (Signed)

## 2013-03-14 ENCOUNTER — Other Ambulatory Visit: Payer: Self-pay

## 2013-07-16 ENCOUNTER — Encounter: Payer: Commercial Managed Care - PPO | Admitting: Internal Medicine

## 2013-07-16 DIAGNOSIS — Z0289 Encounter for other administrative examinations: Secondary | ICD-10-CM

## 2013-11-15 ENCOUNTER — Ambulatory Visit (INDEPENDENT_AMBULATORY_CARE_PROVIDER_SITE_OTHER): Payer: 59 | Admitting: Internal Medicine

## 2013-11-15 ENCOUNTER — Encounter: Payer: Self-pay | Admitting: Internal Medicine

## 2013-11-15 ENCOUNTER — Other Ambulatory Visit: Payer: Self-pay | Admitting: Internal Medicine

## 2013-11-15 VITALS — BP 118/74 | HR 93 | Temp 99.2°F | Resp 16 | Ht 67.0 in | Wt 188.0 lb

## 2013-11-15 DIAGNOSIS — M797 Fibromyalgia: Secondary | ICD-10-CM

## 2013-11-15 DIAGNOSIS — IMO0001 Reserved for inherently not codable concepts without codable children: Secondary | ICD-10-CM

## 2013-11-15 MED ORDER — PREGABALIN 50 MG PO CAPS: 50.0000 mg | ORAL_CAPSULE | Freq: Three times a day (TID) | ORAL | Status: DC

## 2013-11-15 MED ORDER — DULOXETINE HCL 60 MG PO CPEP: 60.0000 mg | ORAL_CAPSULE | Freq: Every day | ORAL | Status: DC

## 2013-11-15 NOTE — Progress Notes (Signed)
   Subjective:    Patient ID: Corey Smith, male    DOB: 21-Mar-1986, 28 y.o.   MRN: [id number]  HPI Pt presents today for management of his fibromyalgia. He was previously being followed by Dr. Kathe Marinerevashour for this problem. He last saw Dr. Kathe Marinerevashour in spring of 2014 at which time they were adjusting some of his medications. He had been on cymbalta and was trialing flexeril and xanax. Shortly after his visit with Dr. Donald Sivaevasour he lost his job and could no longer afford medications nor seeing a specialist. He states he used the flexeril, xanax and ambien until he ran out. He has continued to fill his cymbalta medication as it is generic. He has been on cymbalta alone for the past 6 months.   Today the pt's fibromyalgia is problematic. He has constant mid to lower back pain. The pain is also present in his hips and legs bilaterally. The pain is variant in nature. It is dull at times and sharp and burning at other times. He does have some tingling and numbness in his feet and hands at times.   Review of Systems  Constitutional: Negative for fever and chills.  Respiratory: Negative for shortness of breath.   Genitourinary: Negative for dysuria.       No loss of bowel or bladder  Musculoskeletal: Negative for joint swelling.      Objective:   Physical Exam sweating and mildly tachycardic  Negative SLRs      Assessment & Plan:

## 2013-11-15 NOTE — Telephone Encounter (Signed)
Patient would like refill on duloxetine 60 mg.  [facility].

## 2013-11-15 NOTE — Progress Notes (Signed)
   Subjective:    Patient ID: Corey Smith, male    DOB: 07-Oct-1985, 28 y.o.   MRN: [id number]  HPI He is here for followup of fibromyalgia. He previously saw a rheumatologist but has not seen her since Spring 2014 for financial issues.  He had been prescribed  Cymbalta ; a trial of muscle relaxants and Xanax.  Because he lost his job he wasn't able to afford the medications or see the specialist.  He had used  gabapentin in the past but did not find it particularly of value.  Over the last 6 months he has been on Cymbalta by itself  He has constant mid to lower back pain with pain in the hips and legs bilaterally.  The pain can vary from dull to sharp to burning.  He does have some tingling & numbness in his feet and hands intermittently.    Review of Systems  He denies fever, chills, sweats, weight loss.  No associated rashes .     Objective:   Physical Exam  Significant or distinguishing  findings on physical exam are documented first.  Below that are other systems examined & findings.  He has several tattoos. He also has ear posts bilaterally. jewelry. Head is shaven. He has a slight tachycardia at rest Skin slightly damp. There is increase in tone with range of motion testing of the lower extremities.  General appearance is one of good nourishment w/o distress. Eyes: No conjunctival inflammation or scleral icterus is present. Oral exam: Dental hygiene is good; lips and gums are healthy appearing.There is no oropharyngeal erythema or exudate noted.  Heart:  Normal rhythm. S1 and S2 normal without gallop, murmur, click, rub or other extra sounds   Lungs:Chest clear to auscultation; no wheezes, rhonchi,rales ,or rubs present.No increased work of breathing.  Abdomen: bowel sounds normal, soft and non-tender without masses, organomegaly or hernias noted.  No guarding or rebound .  Musculoskeletal: Able to lie flat and sit up without help. Negative straight leg raising  bilaterally. Gait normal Skin: Intact without suspicious lesions or rashes ; no jaundice or tenting Lymphatic: No lymphadenopathy is noted about the head, neck, axilla              Assessment & Plan:  #1 fibromyalgia  Plan: Sample of Lyrica with titration of dose if needed.  Water aerobics or graduated low impact exercise recommended.

## 2013-11-15 NOTE — Telephone Encounter (Signed)
OK X 3 months 

## 2013-11-15 NOTE — Patient Instructions (Signed)
Please assess your response to Lyrica

## 2013-11-15 NOTE — Progress Notes (Signed)
Pre visit review using our clinic review tool, if applicable. No additional management support is needed unless otherwise documented below in the visit note. 

## 2013-11-21 ENCOUNTER — Telehealth: Payer: Self-pay | Admitting: *Deleted

## 2013-11-21 DIAGNOSIS — M797 Fibromyalgia: Secondary | ICD-10-CM

## 2013-11-21 MED ORDER — PREGABALIN 50 MG PO CAPS: 50.0000 mg | ORAL_CAPSULE | Freq: Three times a day (TID) | ORAL | Status: DC

## 2013-11-21 NOTE — Telephone Encounter (Signed)
GREAT 3 month Rx with 1 refill

## 2013-11-21 NOTE — Telephone Encounter (Signed)
Pt wanted to let md know that the lyrica is working. Would like a rx sent to [facility]...Raechel Chute/lmb

## 2013-11-21 NOTE — Telephone Encounter (Signed)
Notified pt rx sent to [place].../lmb 

## 2014-01-24 ENCOUNTER — Encounter: Payer: Self-pay | Admitting: Internal Medicine

## 2014-01-24 ENCOUNTER — Ambulatory Visit (INDEPENDENT_AMBULATORY_CARE_PROVIDER_SITE_OTHER): Payer: 59 | Admitting: Internal Medicine

## 2014-01-24 VITALS — BP 132/90 | HR 105 | Temp 99.1°F | Ht 67.0 in | Wt 189.0 lb

## 2014-01-24 DIAGNOSIS — E785 Hyperlipidemia, unspecified: Secondary | ICD-10-CM

## 2014-01-24 DIAGNOSIS — Z23 Encounter for immunization: Secondary | ICD-10-CM

## 2014-01-24 DIAGNOSIS — R7309 Other abnormal glucose: Secondary | ICD-10-CM

## 2014-01-24 DIAGNOSIS — M545 Low back pain, unspecified: Secondary | ICD-10-CM | POA: Insufficient documentation

## 2014-01-24 DIAGNOSIS — R7302 Impaired glucose tolerance (oral): Secondary | ICD-10-CM

## 2014-01-24 MED ORDER — TIZANIDINE HCL 4 MG PO TABS: 4.0000 mg | ORAL_TABLET | Freq: Four times a day (QID) | ORAL | Status: DC | PRN

## 2014-01-24 MED ORDER — NAPROXEN 500 MG PO TABS: 500.0000 mg | ORAL_TABLET | Freq: Two times a day (BID) | ORAL | Status: DC

## 2014-01-24 MED ORDER — HYDROCODONE-ACETAMINOPHEN 7.5-325 MG PO TABS: 1.0000 | ORAL_TABLET | Freq: Four times a day (QID) | ORAL | Status: DC | PRN

## 2014-01-24 MED ORDER — PREDNISONE 10 MG PO TABS: ORAL_TABLET | ORAL | Status: DC

## 2014-01-24 MED ORDER — KETOROLAC TROMETHAMINE 30 MG/ML IJ SOLN
30.0000 mg | Freq: Once | INTRAMUSCULAR | Status: AC
  Administered 2014-01-24: 30 mg via INTRAMUSCULAR

## 2014-01-24 NOTE — Patient Instructions (Signed)
You had the flu shot, and the pain shot (toradol) today  Please take all new medication as prescribed - the pain medication, muscle relaxer as needed, and prednisone  Please continue all other medications as before, and refills have been done if requested.  Please have the pharmacy call with any other refills you may need.  Please continue your efforts at being more active, low cholesterol diet, and weight control.  You are given the work note

## 2014-01-24 NOTE — Progress Notes (Signed)
Pre visit review using our clinic review tool, if applicable. No additional management support is needed unless otherwise documented below in the visit note. 

## 2014-01-24 NOTE — Assessment & Plan Note (Addendum)
Neuro exam no change, c/w bilat paravertebral upper lumbar/lower thoracic pain, afeb, atraumtic, hx of MRI 2007 without signficant changes; suspect severe MSK strain, possibly aggrevated by cont'd work x 2 wks -   For toradol IM, limited hydrocodone prn, naproxen bid prn, tizanidine prn, predpac asd,  to f/u any worsening symptoms or concerns, off work note x 1 wk

## 2014-01-24 NOTE — Progress Notes (Signed)
   Subjective:    Patient ID: Corey Smith, male    DOB: 1986-01-08, 28 y.o.   MRN: [id number]  HPI  Here with 2 wks onset  Sharp mid lower back, mod to severe, constant, but worse sometimes, worse to sit, sometimes worse to lie down, better to stand.  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 Has not missed work so far, but pain may have been aggrevated.  No bowel or bladder change, fever, wt loss,  worsening LE pain/numbness/weakness, gait change or falls. Past Medical History  Diagnosis Date  . ANXIETY 06/16/2010  . DEPRESSION 06/16/2010  . HYPERLIPIDEMIA 06/16/2010  . FIBROMYALGIA 06/16/2010  . HYPERTRIGLYCERIDEMIA 06/16/2010  . SCOLIOSIS, MILD 02/16/2007  . WISDOM TEETH EXTRACTION, HX OF 02/16/2007   No past surgical history on file.  reports that he has quit smoking. He does not have any smokeless tobacco history on file. He reports that he does not drink alcohol or use illicit drugs. family history includes Anxiety disorder in his other; Hypertension in his other. No Known Allergies Current Outpatient Prescriptions on File Prior to Visit  Medication Sig Dispense Refill  . DULoxetine (CYMBALTA) 60 MG capsule Take 1 capsule (60 mg total) by mouth daily.  30 capsule  5  . pregabalin (LYRICA) 50 MG capsule Take 1 capsule (50 mg total) by mouth 3 (three) times daily.  270 capsule  1   No current facility-administered medications on file prior to visit.   Review of Systems  Constitutional: Negative for unusual diaphoresis or other sweats  HENT: Negative for ringing in ear Eyes: Negative for double vision or worsening visual disturbance.  Respiratory: Negative for choking and stridor.   Gastrointestinal: Negative for vomiting or other signifcant bowel change Genitourinary: Negative for hematuria or decreased urine volume.  Musculoskeletal: Negative for other MSK pain or swelling Skin: Negative for color change and worsening wound.  Neurological: Negative for tremors and numbness  other than noted  Psychiatric/Behavioral: Negative for decreased concentration or agitation other than above        Objective:   Physical Exam BP 132/90  Pulse 105  Temp(Src) 99.1 F (37.3 C) (Oral)  Ht  (1.702 m)  Wt 189 lb (85.73 kg)  BMI 29.59 kg/m2  SpO2 96% VS noted,  Constitutional: Pt appears well-developed, well-nourished.  HENT: Head: NCAT.  Right Ear: External ear normal.  Left Ear: External ear normal.  Eyes: . Pupils are equal, round, and reactive to light. Conjunctivae and EOM are normal Neck: Normal range of motion. Neck supple.  Cardiovascular: Normal rate and regular rhythm.   Pulmonary/Chest: Effort normal and breath sounds normal.  Abd:  Soft, NT, ND, + BS Spine nontender Has bilat marked tender lower thoracic/upper lumbar muscular firm/tender Neurological: Pt is alert. Not confused , motor grossly intact, sens/dtr intact Skin: Skin is warm. No rash Psychiatric: Pt behavior is normal. No agitation.     Assessment & Plan:

## 2014-01-24 NOTE — Assessment & Plan Note (Signed)
Minor in past, to call for onset polys with predpac tx

## 2014-01-25 NOTE — Assessment & Plan Note (Signed)
D/w pt, Mild to mod, recent data reviewed with pt, and pt to continue medical treatment as before,  to f/u any worsening symptoms or concerns Lab Results  Component Value Date   CHOL 203* 07/11/2012   HDL 39.40 07/11/2012   LDLDIRECT 152.3 07/11/2012   TRIG 131.0 07/11/2012   CHOLHDL 5 07/11/2012

## 2014-02-06 ENCOUNTER — Telehealth: Payer: Self-pay | Admitting: Internal Medicine

## 2014-02-06 NOTE — Telephone Encounter (Signed)
Called left message to call back 

## 2014-02-06 NOTE — Telephone Encounter (Signed)
Should be ok to stop the hydrocodone if running out, and see how it goes on other meds as per last visit  If still having pain, he could consider making appt with Dr Smith/sport med, or I can refer to orthopedic

## 2014-02-06 NOTE — Telephone Encounter (Signed)
Pt called in said that he was told to call in and let Dr know if med were working.  He said that the HYDROcodone-acetaminophen (NORCO) 7.5-325 MG per tablet [[id number]][[id number]] was working and did he need to get a refill??  Please advise       Thank you

## 2014-02-06 NOTE — Telephone Encounter (Signed)
Called the patient informed him of MD instructions.  The patient did verbalize understanding.

## 2014-02-20 ENCOUNTER — Telehealth: Payer: Self-pay | Admitting: Internal Medicine

## 2014-02-20 DIAGNOSIS — G894 Chronic pain syndrome: Secondary | ICD-10-CM

## 2014-02-20 NOTE — Telephone Encounter (Signed)
referral has been done 

## 2014-02-20 NOTE — Telephone Encounter (Signed)
Pt called in and was requesting referral to pain clinic

## 2014-04-01 ENCOUNTER — Other Ambulatory Visit: Payer: Self-pay | Admitting: Pain Medicine

## 2014-04-01 DIAGNOSIS — M545 Low back pain, unspecified: Secondary | ICD-10-CM

## 2014-04-08 ENCOUNTER — Ambulatory Visit
Admission: RE | Admit: 2014-04-08 | Discharge: 2014-04-08 | Disposition: A | Discharge status: Home / Self Care | Payer: 59 | Source: Ambulatory Visit | Attending: Pain Medicine | Admitting: Pain Medicine

## 2014-04-08 ENCOUNTER — Other Ambulatory Visit: Payer: Self-pay | Admitting: Pain Medicine

## 2014-04-08 DIAGNOSIS — T1590XA Foreign body on external eye, part unspecified, unspecified eye, initial encounter: Secondary | ICD-10-CM

## 2014-04-08 DIAGNOSIS — M545 Low back pain, unspecified: Secondary | ICD-10-CM

## 2014-04-22 ENCOUNTER — Other Ambulatory Visit: Payer: Self-pay | Admitting: Internal Medicine

## 2014-05-08 ENCOUNTER — Other Ambulatory Visit: Payer: Self-pay | Admitting: Internal Medicine

## 2014-05-08 NOTE — Telephone Encounter (Signed)
Done hardcopy to robin  

## 2014-05-08 NOTE — Telephone Encounter (Signed)
Fax[facility] hard[facility] for [facility] to [facility].

## 2014-07-14 ENCOUNTER — Other Ambulatory Visit: Payer: Self-pay | Admitting: Internal Medicine

## 2014-08-20 ENCOUNTER — Other Ambulatory Visit: Payer: Self-pay | Admitting: Internal Medicine

## 2014-10-02 ENCOUNTER — Other Ambulatory Visit: Payer: Self-pay | Admitting: Internal Medicine

## 2015-05-11 MED FILL — GABAPENTIN 300 MG CAPSULE: 300 | 30 days supply | Qty: 90 | Fill #2

## 2015-06-15 MED FILL — DULoxetine HCL 60 MG CPEP: 60 | 30 days supply | Qty: 30 | Fill #0

## 2015-09-21 MED FILL — DULoxetine HCL 60 MG CPEP: 60 | 30 days supply | Qty: 30 | Fill #0

## 2015-09-21 MED FILL — tiZANidine HCL 4 MG TABS: 4 | 20 days supply | Qty: 30 | Fill #0

## 2015-09-21 MED FILL — GABAPENTIN 300 MG CAPSULE: 300 | 30 days supply | Qty: 90 | Fill #0

## 2015-10-20 MED FILL — GABAPENTIN 300 MG CAPSULE: 300 | 30 days supply | Qty: 90 | Fill #1

## 2015-12-03 MED FILL — GABAPENTIN 300 MG CAPSULE: 300 | 30 days supply | Qty: 90 | Fill #2

## 2015-12-03 MED FILL — tiZANidine HCL 4 MG TABS: 4 | 20 days supply | Qty: 30 | Fill #1

## 2016-01-01 MED FILL — GABAPENTIN 300 MG CAPSULE: 300 | 30 days supply | Qty: 90 | Fill #0

## 2016-01-03 ENCOUNTER — Encounter (HOSPITAL_BASED_OUTPATIENT_CLINIC_OR_DEPARTMENT_OTHER): Payer: Self-pay | Admitting: Emergency Medicine

## 2016-01-03 DIAGNOSIS — Z87891 Personal history of nicotine dependence: Secondary | ICD-10-CM | POA: Diagnosis not present

## 2016-01-03 DIAGNOSIS — S0532XA Ocular laceration without prolapse or loss of intraocular tissue, left eye, initial encounter: Secondary | ICD-10-CM | POA: Insufficient documentation

## 2016-01-03 DIAGNOSIS — Y999 Unspecified external cause status: Secondary | ICD-10-CM | POA: Diagnosis not present

## 2016-01-03 DIAGNOSIS — Y92410 Unspecified street and highway as the place of occurrence of the external cause: Secondary | ICD-10-CM | POA: Diagnosis not present

## 2016-01-03 DIAGNOSIS — Z79899 Other long term (current) drug therapy: Secondary | ICD-10-CM | POA: Diagnosis not present

## 2016-01-03 DIAGNOSIS — H209 Unspecified iridocyclitis: Secondary | ICD-10-CM | POA: Insufficient documentation

## 2016-01-03 DIAGNOSIS — Y9389 Activity, other specified: Secondary | ICD-10-CM | POA: Insufficient documentation

## 2016-01-03 DIAGNOSIS — S0502XA Injury of conjunctiva and corneal abrasion without foreign body, left eye, initial encounter: Secondary | ICD-10-CM | POA: Diagnosis not present

## 2016-01-03 DIAGNOSIS — S0592XA Unspecified injury of left eye and orbit, initial encounter: Secondary | ICD-10-CM | POA: Diagnosis present
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
## 2016-01-04 ENCOUNTER — Emergency Department (HOSPITAL_BASED_OUTPATIENT_CLINIC_OR_DEPARTMENT_OTHER): Payer: No Typology Code available for payment source

## 2016-01-04 ENCOUNTER — Emergency Department (HOSPITAL_BASED_OUTPATIENT_CLINIC_OR_DEPARTMENT_OTHER)
Admission: EM | Admit: 2016-01-04 | Discharge: 2016-01-04 | Disposition: A | Discharge status: Home / Self Care | Payer: No Typology Code available for payment source | Attending: Emergency Medicine | Admitting: Emergency Medicine

## 2016-01-04 DIAGNOSIS — H209 Unspecified iridocyclitis: Secondary | ICD-10-CM

## 2016-01-04 DIAGNOSIS — S0532XA Ocular laceration without prolapse or loss of intraocular tissue, left eye, initial encounter: Secondary | ICD-10-CM

## 2016-01-04 MED ORDER — ERYTHROMYCIN 5 MG/GM OP OINT: TOPICAL_OINTMENT | OPHTHALMIC | 0 refills | Status: DC

## 2016-01-04 MED ORDER — OXYCODONE-ACETAMINOPHEN 5-325 MG PO TABS: 1.0000 | ORAL_TABLET | Freq: Four times a day (QID) | ORAL | 0 refills | Status: DC | PRN

## 2016-01-04 MED ORDER — OXYCODONE-ACETAMINOPHEN 5-325 MG PO TABS
1.0000 | ORAL_TABLET | Freq: Once | ORAL | Status: AC
  Administered 2016-01-04: 1 via ORAL
  Filled 2016-01-04: qty 1

## 2016-01-04 MED ORDER — FLUORESCEIN SODIUM 1 MG OP STRP
1.0000 | ORAL_STRIP | Freq: Once | OPHTHALMIC | Status: AC
  Administered 2016-01-04: 1 via OPHTHALMIC
  Filled 2016-01-04: qty 1

## 2016-01-04 MED ORDER — PROPARACAINE HCL 0.5 % OP SOLN
1.0000 [drp] | Freq: Once | OPHTHALMIC | Status: AC
  Administered 2016-01-04: 1 [drp] via OPHTHALMIC
  Filled 2016-01-04: qty 15

## 2016-01-04 NOTE — ED Notes (Signed)
Awaiting opthamology consult.

## 2016-01-04 NOTE — Discharge Instructions (Signed)
You were seen today for an injury of the left eye. You have a injury to your cornea as well as stunning of some of the muscles in the eye. There is no evidence of foreign body. You'll be given an antibiotic ointment. It is very important that he follow-up closely with ophthalmology for repeat evaluation.  You can be seen in the office at 10 AM.

## 2016-01-04 NOTE — ED Notes (Signed)
Patient hard to arouse after having percocet. Mother feels patient may have taken herion prior to coming in. Sternal rub was necessary to arouse patient.  Mother reports patient has an addiction problem and would prefer no narcotics be prescribed.
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
## 2016-02-02 MED FILL — GABAPENTIN 300 MG CAPSULE: 300 | 30 days supply | Qty: 90 | Fill #1

## 2016-02-09 MED FILL — QUETIAPINE FUMARATE 100 MG: 100 | 30 days supply | Qty: 30 | Fill #0

## 2016-04-13 DIAGNOSIS — H26132 Total traumatic cataract, left eye: Secondary | ICD-10-CM | POA: Insufficient documentation

## 2016-05-09 DIAGNOSIS — F319 Bipolar disorder, unspecified: Secondary | ICD-10-CM

## 2016-05-09 HISTORY — DX: Bipolar disorder, unspecified: F31.9

## 2016-06-27 DIAGNOSIS — R0683 Snoring: Secondary | ICD-10-CM | POA: Insufficient documentation

## 2017-05-09 HISTORY — PX: CORNEAL TRANSPLANT: SHX108
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
## 2019-05-24 ENCOUNTER — Observation Stay (HOSPITAL_COMMUNITY)
Admission: RE | Admit: 2019-05-24 | Discharge: 2019-05-25 | Disposition: A | Discharge status: Home / Self Care | Payer: Self-pay | Attending: Psychiatry | Admitting: Psychiatry

## 2019-05-24 ENCOUNTER — Other Ambulatory Visit: Payer: Self-pay

## 2019-05-24 ENCOUNTER — Encounter (HOSPITAL_COMMUNITY): Payer: Self-pay | Admitting: Psychiatry

## 2019-05-24 ENCOUNTER — Ambulatory Visit (HOSPITAL_COMMUNITY): Admission: RE | Admit: 2019-05-24 | Payer: Self-pay | Source: Home / Self Care | Admitting: Psychiatry

## 2019-05-24 DIAGNOSIS — Z8249 Family history of ischemic heart disease and other diseases of the circulatory system: Secondary | ICD-10-CM | POA: Insufficient documentation

## 2019-05-24 DIAGNOSIS — F331 Major depressive disorder, recurrent, moderate: Secondary | ICD-10-CM | POA: Diagnosis present

## 2019-05-24 DIAGNOSIS — F1420 Cocaine dependence, uncomplicated: Secondary | ICD-10-CM | POA: Insufficient documentation

## 2019-05-24 DIAGNOSIS — F314 Bipolar disorder, current episode depressed, severe, without psychotic features: Principal | ICD-10-CM | POA: Insufficient documentation

## 2019-05-24 DIAGNOSIS — M797 Fibromyalgia: Secondary | ICD-10-CM | POA: Insufficient documentation

## 2019-05-24 DIAGNOSIS — M419 Scoliosis, unspecified: Secondary | ICD-10-CM | POA: Insufficient documentation

## 2019-05-24 DIAGNOSIS — F419 Anxiety disorder, unspecified: Secondary | ICD-10-CM | POA: Insufficient documentation

## 2019-05-24 DIAGNOSIS — Z20822 Contact with and (suspected) exposure to covid-19: Secondary | ICD-10-CM | POA: Insufficient documentation

## 2019-05-24 DIAGNOSIS — E781 Pure hyperglyceridemia: Secondary | ICD-10-CM | POA: Insufficient documentation

## 2019-05-24 DIAGNOSIS — Z87891 Personal history of nicotine dependence: Secondary | ICD-10-CM | POA: Insufficient documentation

## 2019-05-24 DIAGNOSIS — Z791 Long term (current) use of non-steroidal anti-inflammatories (NSAID): Secondary | ICD-10-CM | POA: Insufficient documentation

## 2019-05-24 DIAGNOSIS — Z79899 Other long term (current) drug therapy: Secondary | ICD-10-CM | POA: Insufficient documentation

## 2019-05-24 DIAGNOSIS — E785 Hyperlipidemia, unspecified: Secondary | ICD-10-CM | POA: Insufficient documentation

## 2019-05-24 HISTORY — DX: Bipolar disorder, unspecified: F31.9

## 2019-05-24 LAB — RESPIRATORY PANEL BY RT PCR (FLU A&B, COVID)
Influenza A by PCR: NEGATIVE
Influenza B by PCR: NEGATIVE
SARS Coronavirus 2 by RT PCR: NEGATIVE

## 2019-05-24 MED ORDER — NICOTINE POLACRILEX 2 MG MT GUM
2.0000 mg | CHEWING_GUM | OROMUCOSAL | Status: DC | PRN
  Administered 2019-05-24 – 2019-05-25 (×2): 2 mg via ORAL
  Filled 2019-05-24 (×2): qty 1

## 2019-05-24 MED ORDER — VENLAFAXINE HCL ER 75 MG PO CP24
225.0000 mg | ORAL_CAPSULE | Freq: Every day | ORAL | Status: DC
  Administered 2019-05-24: 225 mg via ORAL
  Filled 2019-05-24: qty 1

## 2019-05-24 MED ORDER — TRAZODONE HCL 100 MG PO TABS
200.0000 mg | ORAL_TABLET | Freq: Every evening | ORAL | Status: DC | PRN
  Administered 2019-05-24: 200 mg via ORAL
  Filled 2019-05-24: qty 2

## 2019-05-24 MED ORDER — ALUM & MAG HYDROXIDE-SIMETH 200-200-20 MG/5ML PO SUSP: 30.0000 mL | ORAL | Status: DC | PRN

## 2019-05-24 MED ORDER — LURASIDONE HCL 40 MG PO TABS
40.0000 mg | ORAL_TABLET | Freq: Every day | ORAL | Status: DC
  Administered 2019-05-24: 40 mg via ORAL
  Filled 2019-05-24: qty 1

## 2019-05-24 MED ORDER — MAGNESIUM HYDROXIDE 400 MG/5ML PO SUSP: 30.0000 mL | Freq: Every day | ORAL | Status: DC | PRN

## 2019-05-24 MED ORDER — BUSPIRONE HCL 5 MG PO TABS
10.0000 mg | ORAL_TABLET | Freq: Three times a day (TID) | ORAL | Status: DC
  Administered 2019-05-25: 10 mg via ORAL
  Filled 2019-05-24: qty 2

## 2019-05-24 MED ORDER — ACETAMINOPHEN 325 MG PO TABS: 650.0000 mg | ORAL_TABLET | Freq: Four times a day (QID) | ORAL | Status: DC | PRN

## 2019-05-24 MED ORDER — HYDROXYZINE HCL 25 MG PO TABS
25.0000 mg | ORAL_TABLET | Freq: Three times a day (TID) | ORAL | Status: DC | PRN
  Administered 2019-05-24: 25 mg via ORAL
  Filled 2019-05-24: qty 1

## 2019-05-24 NOTE — BH Assessment (Signed)
Assessment Note  Corey Smith is an 34 y.o. male presenting voluntarily to [facility] for assessment. Patient seeking assistance for his cocaine addiction as well as his bipolar disorder. Patient reports he goes to [facility] and takes his bipolar medications as prescribed, however has been more depressed lately and has not experienced manic symptoms for some time. He denies SI/HI/AVH. Patient reports using 1-1.5 grams of cocaine every other day. Patient also admits to occasionally using Percocet. Patient has a history of heroin addiction, which he has been clean from for 3 years with the help of MAT. He no longer uses methadone. Patient denies any criminal charges or trauma history.  Patient is alert and oriented x 4. His speech is logical, eye contact is poor, and thoughts are organized. His mood is depressed and affect is congruent. He has fair insight, judgement, and impulse control. He does not appear to be responding to internal stimuli or experiencing delusional thought content.  Diagnosis: F31.4 Bipolar I, current episode depressed, severe   F14.20 Cocaine use disorder, severe  Past Medical History:  Past Medical History:  Diagnosis Date  . ANXIETY 06/16/2010  . DEPRESSION 06/16/2010  . FIBROMYALGIA 06/16/2010  . HYPERLIPIDEMIA 06/16/2010  . HYPERTRIGLYCERIDEMIA 06/16/2010  . SCOLIOSIS, MILD 02/16/2007  . WISDOM TEETH EXTRACTION, HX OF 02/16/2007    Past Surgical History:  Procedure Laterality Date  . WISDOM TOOTH EXTRACTION      Family History:  Family History  Problem Relation Age of Onset  . Hypertension Other   . Anxiety disorder Other     Social History:  reports that he has quit smoking. His smoking use included cigarettes. He smoked 1.50 packs per day. He has never used smokeless tobacco. He reports that he does not drink alcohol or use drugs.  Additional Social History:  Alcohol / Drug Use Pain Medications: see MAR Prescriptions: see MAR Over the Counter: see MAR History of  alcohol / drug use?: Yes Substance #1 Name of Substance 1: cocaine 1 - Age of First Use: 31 1 - Amount (size/oz): 1-1.5 grams 1 - Frequency: every other day 1 - Duration: UTA 1 - Last Use / Amount: 1/13- 1 gram Substance #2 Name of Substance 2: heroin 2 - Age of First Use: 27 2 - Amount (size/oz): varies 2 - Frequency: daily 2 - Duration: 3 years 2 - Last Use / Amount: 3 years ago  CIWA:   COWS:    Allergies: No Known Allergies  Home Medications: (Not in a hospital admission)   OB/GYN Status:  No LMP for male patient.  General Assessment Data Location of Assessment: [facility] Assessment Services TTS Assessment: In system Is this a Tele or Face-to-Face Assessment?: Face-to-Face Is this an Initial Assessment or a Re-assessment for this encounter?: Initial Assessment Patient Accompanied by:: N/A Language Other than English: No Living Arrangements: Other (Comment)(parent's home) What gender do you identify as?: Male Marital status: Single Maiden name: Slabach Pregnancy Status: No Living Arrangements: Parent Can pt return to current living arrangement?: Yes Admission Status: Voluntary Is patient capable of signing voluntary admission?: Yes Referral Source: Self/Family/Friend Insurance type: none  Medical Screening Exam [facility] Walk-in ONLY) Medical Exam completed: Yes  Crisis Care Plan Living Arrangements: Parent Legal Guardian: (self) Name of Psychiatrist: [facility] Name of Therapist: none  Education Status Is patient currently in school?: No Is the patient employed, unemployed or receiving disability?: Unemployed  Risk to self with the past 6 months Suicidal Ideation: No Has patient been a risk to self within  the past 6 months prior to admission? : No Suicidal Intent: No Has patient had any suicidal intent within the past 6 months prior to admission? : No Is patient at risk for suicide?: No Suicidal Plan?: No Has patient had any suicidal plan within the past 6 months  prior to admission? : No Access to Means: No What has been your use of drugs/alcohol within the last 12 months?: cocaine use Previous Attempts/Gestures: Yes How many times?: 1 Other Self Harm Risks: none noted Triggers for Past Attempts: None known Intentional Self Injurious Behavior: None Family Suicide History: No Recent stressful life event(s): Job Loss Persecutory voices/beliefs?: No Depression: Yes Depression Symptoms: Despondent, Insomnia, Tearfulness, Isolating, Fatigue, Guilt, Loss of interest in usual pleasures, Feeling worthless/self pity, Feeling angry/irritable Substance abuse history and/or treatment for substance abuse?: No Suicide prevention information given to non-admitted patients: Not applicable  Risk to Others within the past 6 months Homicidal Ideation: No Does patient have any lifetime risk of violence toward others beyond the six months prior to admission? : No Thoughts of Harm to Others: No Current Homicidal Intent: No Current Homicidal Plan: No Access to Homicidal Means: No Identified Victim: none History of harm to others?: No Assessment of Violence: None Noted Violent Behavior Description: none Does patient have access to weapons?: No Criminal Charges Pending?: No Does patient have a court date: No Is patient on probation?: No  Psychosis Hallucinations: None noted Delusions: None noted  Mental Status Report Appearance/Hygiene: Unremarkable Eye Contact: Fair Motor Activity: Freedom of movement Speech: Logical/coherent Level of Consciousness: Alert Mood: Depressed Affect: Depressed Anxiety Level: Moderate Thought Processes: Coherent, Relevant Judgement: Partial Orientation: Person, Place, Time, Situation Obsessive Compulsive Thoughts/Behaviors: None  Cognitive Functioning Concentration: Normal Memory: Recent Intact, Remote Intact Is patient IDD: No Insight: Fair Impulse Control: Fair Appetite: Good Have you had any weight changes? : No  Change Sleep: Increased Total Hours of Sleep: 10 Vegetative Symptoms: Staying in bed  ADLScreening [facility]) Patient's cognitive ability adequate to safely complete daily activities?: Yes Patient able to express need for assistance with ADLs?: Yes Independently performs ADLs?: Yes (appropriate for developmental age)  Prior Inpatient Therapy Prior Inpatient Therapy: No  Prior Outpatient Therapy Prior Outpatient Therapy: Yes Prior Therapy Dates: 2018 Prior Therapy Facilty/Provider(s): [facility] Reason for Treatment: heroin addiction Does patient have an ACCT team?: No Does patient have Intensive In-House Services?  : No Does patient have [facility] services? : Yes Does patient have P4CC services?: No  ADL Screening (condition at time of admission) Patient's cognitive ability adequate to safely complete daily activities?: Yes Is the patient deaf or have difficulty hearing?: No Does the patient have difficulty seeing, even when wearing glasses/contacts?: No Does the patient have difficulty concentrating, remembering, or making decisions?: No Patient able to express need for assistance with ADLs?: Yes Does the patient have difficulty dressing or bathing?: No Independently performs ADLs?: Yes (appropriate for developmental age) Does the patient have difficulty walking or climbing stairs?: No Weakness of Legs: None Weakness of Arms/Hands: None  Home Assistive Devices/Equipment Home Assistive Devices/Equipment: None  Therapy Consults (therapy consults require a physician order) PT Evaluation Needed: No OT Evalulation Needed: No SLP Evaluation Needed: No Abuse/Neglect Assessment (Assessment to be complete while patient is alone) Abuse/Neglect Assessment Can Be Completed: Yes Physical Abuse: Denies Verbal Abuse: Denies Sexual Abuse: Denies Exploitation of patient/patient's resources: Denies Self-Neglect: Denies Values / Beliefs Cultural Requests During  Hospitalization: None Spiritual Requests During Hospitalization: None Consults Spiritual Care Consult Needed:  No Transition of Care Team Consult Needed: No            Disposition: Malachy Chamber, PMHNP recommends overnight observation. Disposition Initial Assessment Completed for this Encounter: Yes Disposition of Patient: Discharge Patient refused recommended treatment: No Mode of transportation if patient is discharged/movement?: Car  On Site Evaluation by:   Reviewed with Physician:    Celedonio Miyamoto 05/24/2019 6:36 PM

## 2019-05-24 NOTE — Plan of Care (Signed)
[facility] Observation Crisis Plan  Reason for Crisis Plan:  Crisis Stabilization   Plan of Care:  Referral for Inpatient Hospitalization  Family Support:      Current Living Environment:     Insurance:   Hospital Account    Name Acct ID Class Status Primary Coverage   Mariano, Doshi [id number] BEHAVIORAL HEALTH OBSERVATION Open None        Guarantor Account (for Hospital Account [id number])    Name Relation to Pt Service Area Active? Acct Type   [name] Self [facility] Yes Behavioral Health   Address Phone       [address] [place], [place] [address] [phone](H)          Coverage Information (for Hospital Account [id number])    Not on file      Legal Guardian:     Primary Care Provider:  Patient, No Pcp Per  Current Outpatient Providers:  [facility]  Psychiatrist:     Counselor/Therapist:     Compliant with Medications:  Yes  Additional Information:   Tasia Catchings 1/15/20219:45 PM

## 2019-05-24 NOTE — Progress Notes (Signed)
Patient ID: Corey Smith, male   DOB: Apr 19, 1986, 34 y.o.   MRN: [id number] Pt brought over from [address] hall. Pt appears calm and cooperative. Medication given by previous nurse. Pt requested snacks which was provided. Will continue to monitor for safety and stability.

## 2019-05-24 NOTE — H&P (Signed)
BH Observation Unit Provider Admission PAA/H&P  Patient Identification: Corey Smith MRN:  [id number] Date of Evaluation:  05/24/2019 Chief Complaint:  pending Principal Diagnosis: <principal problem not specified> Diagnosis:  Active Problems:   * No active hospital problems. * CC: I want to get my medications right. That doctor I am seeing is not treating me well. I also want to detox History of Present Illness:  Corey Smith is an 34 y.o. male presenting voluntarily to [facility] for assessment. Patient seeking assistance for his cocaine addiction as well as his bipolar disorder. Patient reports he goes to [facility] and takes his bipolar medications as prescribed, however has been more depressed lately and has not experienced manic symptoms for some time. He denies SI/HI/AVH. Patient reports using 1-1.5 grams of cocaine every other day. Patient also admits to occasionally using Percocet. Patient has a history of heroin addiction, which he has been clean from for 3 years with the help of MAT. He no longer uses methadone. Patient denies any criminal charges or trauma history.  During the evaluation the patient is alert and oriented, calm and cooperative. He endorses ongoing substance use for a total about 15 years to include heroin, cocaine, alcohol, marijuana, and ectasy. He states he only uses cocaine at this time. He does endorse some motivation to quit using drugs and would like assistance with his medication management. At this time he denies suicidal ideation, homicidal ideation and or hallucinations. However he does endorse persistent neurovegetative symptoms and worsening depression symptoms.   Associated Signs/Symptoms: Depression Symptoms:  depressed mood, hypersomnia, psychomotor retardation, fatigue, feelings of worthlessness/guilt, difficulty concentrating, hopelessness, anxiety, loss of energy/fatigue, disturbed sleep, (Hypo) Manic Symptoms:  Impulsivity, Irritable Mood, Anxiety  Symptoms:  Excessive Worry, Panic Symptoms, Psychotic Symptoms:  Denies PTSD Symptoms: Negative Total Time spent with patient: 45 minutes  Past Psychiatric History:Bipolar, and polysubstance abuse. Currently under the care of Dr. Lester Kinsman at [facility]. He is currently compliant with his psych medis that include Effexor XR 150mg  po daily, Latuda 20mg  po daily, Buspar 10mg  po TID, and Trazodone 200mg  po qhs. He reports 5 previous suicide attempts with various methods of lethality to include hanging, overdose, and cutting of wrist that required medical interventions. He denies any therapis at this time.   Is the patient at risk to self? Yes.    Has the patient been a risk to self in the past 6 months? Yes.    Has the patient been a risk to self within the distant past? Yes.    Is the patient a risk to others? No.  Has the patient been a risk to others in the past 6 months? No.  Has the patient been a risk to others within the distant past? No.   Prior Inpatient Therapy:  Denies  Prior Outpatient Therapy:  [facility]  Alcohol Screening:   Substance Abuse History in the last 12 months:  Yes.   Consequences of Substance Abuse: Medical Consequences:  Poor dentition, cv disease Legal Consequences:  jail time, probrations Previous Psychotropic Medications: Yes  Psychological Evaluations: Yes  Past Medical History:  Past Medical History:  Diagnosis Date  . ANXIETY 06/16/2010  . DEPRESSION 06/16/2010  . FIBROMYALGIA 06/16/2010  . HYPERLIPIDEMIA 06/16/2010  . HYPERTRIGLYCERIDEMIA 06/16/2010  . SCOLIOSIS, MILD 02/16/2007  . WISDOM TEETH EXTRACTION, HX OF 02/16/2007    Past Surgical History:  Procedure Laterality Date  . WISDOM TOOTH EXTRACTION     Family History:  Family History  Problem Relation Age of Onset  .  Hypertension Other   . Anxiety disorder Other    Family Psychiatric History:Denies Tobacco Screening:   Social History:  Social History   Substance and Sexual Activity  Alcohol Use No      Social History   Substance and Sexual Activity  Drug Use No    Additional Social History:                           Allergies:  No Known Allergies Lab Results: No results found for this or any previous visit (from the past 48 hour(s)).  Blood Alcohol level:  No results found for: [facility]  Metabolic Disorder Labs:  Lab Results  Component Value Date   HGBA1C 5.7 07/11/2012   No results found for: PROLACTIN Lab Results  Component Value Date   CHOL 203 (H) 07/11/2012   TRIG 131.0 07/11/2012   HDL 39.40 07/11/2012   CHOLHDL 5 07/11/2012   VLDL 26.2 07/11/2012    Current Medications: Current Outpatient Medications  Medication Sig Dispense Refill  . DULoxetine (CYMBALTA) 60 MG capsule Take 1 capsule (60 mg total) by mouth daily. 30 capsule 5  . erythromycin ophthalmic ointment Place a 1/2 inch ribbon of ointment into the lower eyelid TID 1 g 0  . HYDROcodone-acetaminophen (NORCO) 7.5-325 MG per tablet Take 1 tablet by mouth every 6 (six) hours as needed for moderate pain. 50 tablet 0  . LYRICA 50 MG capsule TAKE 1 CAPSULE BY MOUTH 3 TIMES DAILY 270 capsule 1  . naproxen (NAPROSYN) 500 MG tablet Take 1 tablet (500 mg total) by mouth 2 (two) times daily with a meal. As needed for pain 60 tablet 2  . oxyCODONE-acetaminophen (PERCOCET/ROXICET) 5-325 MG tablet Take 1 tablet by mouth every 6 (six) hours as needed for severe pain. 10 tablet 0  . predniSONE (DELTASONE) 10 MG tablet 3 tabs by mouth per day for 3 days,2tabs per day for 3 days,1tab per day for 3 days 18 tablet 0  . tiZANidine (ZANAFLEX) 4 MG tablet TAKE 1 TABLET BY MOUTH EVERY 6 HOURS AS NEEDED FOR MUSCLE SPASMS 60 tablet 0   No current facility-administered medications for this encounter.   PTA Medications: (Not in a hospital admission)   Musculoskeletal: Strength & Muscle Tone: within normal limits Gait & Station: normal Patient leans: N/A  Psychiatric Specialty Exam: Physical Exam  Review of  Systems  Blood pressure 136/89, pulse (!) 101, temperature 98.7 F (37.1 C), temperature source Oral, resp. rate 18, SpO2 95 %.There is no height or weight on file to calculate BMI.  General Appearance: Fairly Groomed  Eye Contact:  Minimal  Speech:  Clear and Coherent and Slow  Volume:  Normal  Mood:  Depressed  Affect:  Depressed  Thought Process:  Coherent, Linear and Descriptions of Associations: Intact  Orientation:  Full (Time, Place, and Person)  Thought Content:  Logical  Suicidal Thoughts:  No  Homicidal Thoughts:  No  Memory:  Immediate;   Fair Recent;   Fair  Judgement:  Intact  Insight:  Lacking  Psychomotor Activity:  Psychomotor Retardation  Concentration:  Concentration: Fair and Attention Span: Fair  Recall:  [employer] of Knowledge:  Fair  Language:  Fair  Akathisia:  No  Handed:  Right  AIMS (if indicated):     Assets:  Communication Skills Desire for Improvement Financial Resources/Insurance Leisure Time Physical Health Social Support Vocational/Educational  ADL's:  Intact  Cognition:  WNL  Sleep:  Treatment Plan Summary: Daily contact with patient to assess and evaluate symptoms and progress in treatment, Medication management and Plan admit to obs at this time. Increase latuda 40mg  po qhs for bipolar disorder. Increase effexor xr 225mg  for depression, and continue buspar 10mg  po tid, and trazodone. Patient explained we do not offer detox for cocaine, and since he last used on wednesday he is half way throught his acute detox process. WIll treat symptoms of pain with NSAIDS and order baseline labs and covid testing.   Observation Level/Precautions:  15 minute checks Laboratory:  CBC Chemistry Profile UDS UA Psychotherapy:   Medications:   Consultations:   Discharge Concerns:   Estimated LOS: 24-48 hours Other:      Suella Broad, FNP 1/15/20216:49 PM

## 2019-05-24 NOTE — Progress Notes (Signed)
Patient ID: Corey Smith, male   DOB: 1985/12/18, 34 y.o.   MRN: [id number] Pt A&O x 4, presents with depression and seeking assistance for cocaine addiction.  Denies SI/HI or AVH.  Pt admits to using 1-2 Grams of cocaine every other day.  Monitoring for safety.  Pt anxious and cooperative.

## 2019-05-25 ENCOUNTER — Ambulatory Visit (HOSPITAL_COMMUNITY): Payer: Self-pay

## 2019-05-25 DIAGNOSIS — F331 Major depressive disorder, recurrent, moderate: Secondary | ICD-10-CM

## 2019-05-25 LAB — COMPREHENSIVE METABOLIC PANEL
ALT: 23 U/L (ref 0–44)
AST: 18 U/L (ref 15–41)
Albumin: 4 g/dL (ref 3.5–5.0)
Alkaline Phosphatase: 75 U/L (ref 38–126)
Anion gap: 8 (ref 5–15)
BUN: 11 mg/dL (ref 6–20)
CO2: 32 mmol/L (ref 22–32)
Calcium: 9.3 mg/dL (ref 8.9–10.3)
Chloride: 99 mmol/L (ref 98–111)
Creatinine, Ser: 0.79 mg/dL (ref 0.61–1.24)
GFR calc Af Amer: >60 mL/min (ref >60)
GFR calc non Af Amer: >60 mL/min (ref >60)
Glucose, Bld: 91 mg/dL (ref 70–99)
Potassium: 4.2 mmol/L (ref 3.5–5.1)
Sodium: 139 mmol/L (ref 135–145)
Total Bilirubin: 0.3 mg/dL (ref 0.3–1.2)
Total Protein: 6.9 g/dL (ref 6.5–8.1)

## 2019-05-25 LAB — CBC
HCT: 49.2 % (ref 39.0–52.0)
Hemoglobin: 15.8 g/dL (ref 13.0–17.0)
MCH: 31.5 pg (ref 26.0–34.0)
MCHC: 32.1 g/dL (ref 30.0–36.0)
MCV: 98.2 fL (ref 80.0–100.0)
Platelets: 232 10*3/uL (ref 150–400)
RBC: 5.01 MIL/uL (ref 4.22–5.81)
RDW: 13.7 % (ref 11.5–15.5)
WBC: 9.2 10*3/uL (ref 4.0–10.5)
nRBC: 0 % (ref 0.0–0.2)

## 2019-05-25 LAB — TSH: TSH: 0.549 u[IU]/mL (ref 0.350–4.500)

## 2019-05-25 MED ORDER — BUSPIRONE HCL 10 MG PO TABS: 10.0000 mg | ORAL_TABLET | Freq: Three times a day (TID) | ORAL | 0 refills | Status: DC

## 2019-05-25 NOTE — [facility] Suicide Risk Assessment (Cosign Needed)
Suicide Risk Assessment  Discharge Assessment   [facility] Discharge Suicide Risk Assessment   Principal Problem: MDD (major depressive disorder), recurrent episode, moderate (HCC) Discharge Diagnoses: Principal Problem:   MDD (major depressive disorder), recurrent episode, moderate (HCC)   Total Time spent with patient: 15 minutes  Musculoskeletal: Strength & Muscle Tone: within normal limits Gait & Station: normal Patient leans: N/A  Psychiatric Specialty Exam:   Blood pressure 123/74, pulse 100, temperature 98 F (36.7 C), temperature source Oral, resp. rate 18, height 5' 6.5" (1.689 m), weight 87.1 kg, SpO2 98 %.Body mass index is 30.53 kg/m.  General Appearance: Casual  Eye Contact::  Good  Speech:  Clear and Coherent  Volume:  Normal  Mood:  Anxious and Depressed  Affect:  Congruent  Thought Process:  Coherent  Orientation:  Full (Time, Place, and Person)  Thought Content:  Logical  Suicidal Thoughts:  No  Homicidal Thoughts:  No  Memory:  Immediate;   Fair Remote;   Fair  Judgement:  Fair  Insight:  Fair  Psychomotor Activity:  Normal  Concentration:  Fair  Recall:  [employer] of Knowledge:Fair  Language: Fair  Akathisia:  No  Handed:  Right  AIMS (if indicated):     Assets:  Communication Skills Desire for Improvement Resilience Social Support  Sleep:     Cognition: WNL  ADL's:  Intact   Mental Status Per Nursing Assessment::   On Admission:  NA  Demographic Factors:  Caucasian  Loss Factors: NA  Historical Factors: Family history of mental illness or substance abuse  Risk Reduction Factors:   Employed  Continued Clinical Symptoms:  Bipolar Disorder:   Depressive phase  Cognitive Features That Contribute To Risk:  Closed-mindedness    Suicide Risk:  Minimal: No identifiable suicidal ideation.  Patients presenting with no risk factors but with morbid ruminations; may be classified as minimal risk based on the severity of the depressive  symptoms    Plan Of Care/Follow-up recommendations:  Activity:  as tolorated  Heart healthy Follow-up with peers support CSW to provide additional outpatient resources  Oneta Rack, NP 05/25/2019, 11:45 AM

## 2019-05-25 NOTE — Discharge Summary (Addendum)
Physician Discharge Summary Note  Patient:  Corey Smith is an 34 y.o., male MRN:  [id number] DOB:  1985/07/20 Patient phone:  [phone] (home)  Patient address:   [address] [place] [address],  Total Time spent with patient: 15 minutes  Date of Admission:  05/24/2019 Date of Discharge: 05/25/2019   Reason for Admission:  Per admission assessment note: Corey Smith is an 34 y.o. male presenting voluntarily to [facility] for assessment. Patient seeking assistance for his cocaine addiction as well as his bipolar disorder. Patient reports he goes to [facility] and takes his bipolar medications as prescribed, however has been more depressed lately and has not experienced manic symptoms for some time. He denies SI/HI/AVH. Patient reports using 1-1.5 grams of cocaine every other day. Patient also admits to occasionally using Percocet. Patient has a history of heroin addiction, which he has been clean from for 3 years with the help of MAT. He no longer uses methadone. Patient denies any criminal charges or trauma history.  Evaluation: Corey Smith with seen and evaluated by attending psychiatrist Corey Smith and nurse practitioners.  Denying suicidal or homicidal ideations.  Denies auditory or visual hallucinations.  Patient is requesting additional resources for substance abuse.  Reported history of depression and anxiety.  States he is followed by [facility]er however is requesting help with his cocaine use/abuse.  Denied previous inpatient admission for long-term rehabilitation.  Discussed follow-up with peers support.  CSW to provide additional outpatient resources.  Support,encouragement and reassurance was provided.   Principal Problem: MDD (major depressive disorder), recurrent episode, moderate (HCC) Discharge Diagnoses: Principal Problem:   MDD (major depressive disorder), recurrent episode, moderate (HCC)   Past Psychiatric History:   Past Medical History:  Past Medical History:  Diagnosis  Date   ANXIETY 06/16/2010   Bipolar disorder (HCC)    DEPRESSION 06/16/2010   FIBROMYALGIA 06/16/2010   HYPERLIPIDEMIA 06/16/2010   HYPERTRIGLYCERIDEMIA 06/16/2010   SCOLIOSIS, MILD 02/16/2007   WISDOM TEETH EXTRACTION, HX OF 02/16/2007    Past Surgical History:  Procedure Laterality Date   WISDOM TOOTH EXTRACTION     Family History:  Family History  Problem Relation Age of Onset   Hypertension Other    Anxiety disorder Other    Family Psychiatric  History:  Social History:  Social History   Substance and Sexual Activity  Alcohol Use No     Social History   Substance and Sexual Activity  Drug Use No    Social History   Socioeconomic History   Marital status: Married    Spouse name: Not on file   Number of children: Not on file   Years of education: Not on file   Highest education level: Not on file  Occupational History   Not on file  Tobacco Use   Smoking status: Former Smoker    Packs/day: 1.50    Types: Cigarettes   Smokeless tobacco: Never Used  Substance and Sexual Activity   Alcohol use: No   Drug use: No   Sexual activity: Not on file  Other Topics Concern   Not on file  Social History Narrative   Not on file   Social Determinants of Health   Financial Resource Strain:    Difficulty of Paying Living Expenses: Not on file  Food Insecurity:    Worried About Running Out of Food in the Last Year: Not on file   [employer] of Food in the Last Year: Not on file  Transportation Needs:  Lack of Transportation (Medical): Not on file   Lack of Transportation (Non-Medical): Not on file  Physical Activity:    Days of Exercise per Week: Not on file   Minutes of Exercise per Session: Not on file  Stress:    Feeling of Stress : Not on file  Social Connections:    Frequency of Communication with Friends and Family: Not on file   Frequency of Social Gatherings with Friends and Family: Not on file   Attends Religious Services: Not on file   Active Member of Clubs  or Organizations: Not on file   Attends Archivist Meetings: Not on file   Marital Status: Not on file    Hospital Course:  PRADEEP BEAUBRUN was admitted for MDD (major depressive disorder), recurrent episode, moderate ([place]) and crisis management.  Pt was treated discharged with the medications listed below under Medication List.  Medical problems were identified and treated as needed.  Home medications were restarted as appropriate.  Improvement was monitored by observation and Corey Smith 's daily report of symptom reduction.  Emotional and mental status was monitored by daily self-inventory reports completed by Corey Smith and clinical staff.         Corey Smith was evaluated by the treatment team for stability and plans for continued recovery upon discharge. Corey Smith 's motivation was an integral factor for scheduling further treatment. Employment, transportation, bed availability, health status, family support, and any pending legal issues were also considered during hospital stay. Pt was offered further treatment options upon discharge including but not limited to Residential, Intensive Outpatient, and Outpatient treatment.  Corey Smith will follow up with the services as listed below under Follow Up Information.     Upon completion of this admission the patient was both mentally and medically stable for discharge denying suicidal/homicidal ideation, auditory/visual/tactile hallucinations, delusional thoughts and paranoia.    Corey Smith demonstrated improvement without reported or observed adverse effects to the point of stability appropriate for outpatient management. Pertinent labs include: UDS pending results., for which outpatient follow-up is necessary for lab recheck as mentioned below. Reviewed CBC, CMP, BAL, and UDS; all unremarkable aside from noted exceptions.   Physical Findings: AIMS: Facial and Oral Movements Muscles of Facial Expression: None,  normal Lips and Perioral Area: None, normal Jaw: None, normal Tongue: None, normal,Extremity Movements Upper (arms, wrists, hands, fingers): None, normal Lower (legs, knees, ankles, toes): None, normal, Trunk Movements Neck, shoulders, hips: None, normal, Overall Severity Severity of abnormal movements (highest score from questions above): None, normal Incapacitation due to abnormal movements: None, normal Patient's awareness of abnormal movements (rate only patient's report): No Awareness, Dental Status Current problems with teeth and/or dentures?: No Does patient usually wear dentures?: No  CIWA:  CIWA-Ar Total: 2 COWS:  COWS Total Score: 3  Musculoskeletal: Strength & Muscle Tone: within normal limits Gait & Station: normal Patient leans: N/A  Psychiatric Specialty Exam: see SRA  Physical Exam  Vitals reviewed. Constitutional: He appears well-developed.  Cardiovascular: Normal rate.  Skin: Skin is warm.    Review of Systems  All other systems reviewed and are negative.   Blood pressure 123/74, pulse 100, temperature 98 F (36.7 C), temperature source Oral, resp. rate 18, height 5' 6.5" (1.689 m), weight 87.1 kg, SpO2 98 %.Body mass index is 30.53 kg/m.     Has this patient used any form of tobacco in the last 30 days? (Cigarettes, Smokeless Tobacco, Cigars, and/or Pipes)  Yes, No  Blood Alcohol level:  No results found for: [facility]  Metabolic Disorder Labs:  Lab Results  Component Value Date   HGBA1C 5.7 07/11/2012   No results found for: PROLACTIN Lab Results  Component Value Date   CHOL 203 (H) 07/11/2012   TRIG 131.0 07/11/2012   HDL 39.40 07/11/2012   CHOLHDL 5 07/11/2012   VLDL 26.2 07/11/2012    See Psychiatric Specialty Exam and Suicide Risk Assessment completed by Attending Physician prior to discharge.  Discharge destination:  Home  Is patient on multiple antipsychotic therapies at discharge:  No   Has Patient had three or more failed trials of  antipsychotic monotherapy by history:  No  Recommended Plan for Multiple Antipsychotic Therapies: NA  Discharge Instructions     Diet - low sodium heart healthy   Complete by: As directed    Discharge instructions   Complete by: As directed    Increase activity slowly   Complete by: As directed       Allergies as of 05/25/2019   No Known Allergies      Medication List     STOP taking these medications    DULoxetine 60 MG capsule Commonly known as: CYMBALTA   erythromycin ophthalmic ointment   HYDROcodone-acetaminophen 7.5-325 MG tablet Commonly known as: NORCO   Lyrica 50 MG capsule Generic drug: pregabalin   naproxen 500 MG tablet Commonly known as: Naprosyn   oxyCODONE-acetaminophen 5-325 MG tablet Commonly known as: PERCOCET/ROXICET   predniSONE 10 MG tablet Commonly known as: DELTASONE   tiZANidine 4 MG tablet Commonly known as: ZANAFLEX       TAKE these medications      Indication  busPIRone 10 MG tablet Commonly known as: BUSPAR Take 1 tablet (10 mg total) by mouth 3 (three) times daily.  Indication: Major Depressive Disorder          Follow-up recommendations:  Activity:  as tolerated Diet:  heart healthy   CSW to provide additional outpatient resources Orders placed for peers support follow-up  Comments:  Take all medications as prescribed. Keep all follow-up appointments as scheduled.  Do not consume alcohol or use illegal drugs while on prescription medications. Report any adverse effects from your medications to your primary care provider promptly.  In the event of recurrent symptoms or worsening symptoms, call 911, a crisis hotline, or go to the nearest emergency department for evaluation.   Signed: Oneta Rack, NP 05/25/2019, 11:42 AM Patient seen face-to-face for psychiatric evaluation, chart reviewed and case discussed with the physician extender and developed treatment plan. Reviewed the information documented and agree  with the treatment plan. Thedore Mins, MD

## 2019-05-25 NOTE — Progress Notes (Signed)
[place] NOVEL CORONAVIRUS (COVID-19) DAILY CHECK-OFF SYMPTOMS - answer yes or no to each - every day NO YES  Have you had a fever in the past 24 hours?  . Fever (Temp > 37.80C / 100F) X   Have you had any of these symptoms in the past 24 hours? . New Cough .  Sore Throat  .  Shortness of Breath .  Difficulty Breathing .  Unexplained Body Aches   X   Have you had any one of these symptoms in the past 24 hours not related to allergies?   . Runny Nose .  Nasal Congestion .  Sneezing   X   If you have had runny nose, nasal congestion, sneezing in the past 24 hours, has it worsened?  X   EXPOSURES - check yes or no X   Have you traveled outside the state in the past 14 days?  X   Have you been in contact with someone with a confirmed diagnosis of COVID-19 or PUI in the past 14 days without wearing appropriate PPE?  X   Have you been living in the same home as a person with confirmed diagnosis of COVID-19 or a PUI (household contact)?    X   Have you been diagnosed with COVID-19?    X              What to do next: Answered NO to all: Answered YES to anything:   Proceed with unit schedule Follow the [facility] Inpatient Flowsheet.   

## 2019-05-25 NOTE — Patient Outreach (Signed)
CPSS met with the patient at the [facility] Observation Unit in order to provide substance use recovery support and provide information for substance use recovery resources. Patient reports a recent history of cocaine use. Patient is interested in residential substance use treatment. CPSS talked to the patient about [facility], [facility], and [facility] as options for residential substance use treatment. CPSS also talked to the patient about [place] houses as a substance use recovery option. CPSS provided the patient with follow up information for these residential substance use treatment centers with a residential substance use treatment center list. CPSS also provided information for several different substance use recovery resources. Some of these resources include outpatient substance use treatment center list, [facility] NA meeting list, [place] vacancy list/flier detailing [employer], flier for [employer] [facility] [place] outpatient dual diagnosis treatment services, and CPSS contact information. CPSS strongly encouraged the patient to follow with CPSS if needed for further help with CPSS substance use recovery outreach services.

## 2019-05-25 NOTE — Progress Notes (Addendum)
D: Pt A & O X 4. Presents with flat affect, depressed mood, logical speech and is pleasant on interactions. Denies SI, HI, AVH and pain at this time. D/C home as ordered "I'm ready to leave". Escorted to lobby by Clinical research associate at time of d/c.  A: D/C instructions reviewed with pt including peer support resources for substance abuse; compliance encouraged. All belongings from locker 53 returned to pt at time of departure. Scheduled  medication administered as ordered with verbal education and effects monitored. Safety checks maintained without incident till time of d/c.  R: Pt receptive to care. Compliant with medications when offered this morning. Denies adverse drug reactions when assessed. Verbalized understanding related to d/c instructions. Signed belonging sheet in agreement with items received from locker. Ambulatory with a steady gait. Appears to be in no physical distress at time of departure.

## 2019-06-13 DIAGNOSIS — F32A Depression, unspecified: Secondary | ICD-10-CM | POA: Insufficient documentation

## 2019-06-13 DIAGNOSIS — F17210 Nicotine dependence, cigarettes, uncomplicated: Secondary | ICD-10-CM | POA: Insufficient documentation

## 2019-08-29 ENCOUNTER — Ambulatory Visit (HOSPITAL_COMMUNITY)
Admission: RE | Admit: 2019-08-29 | Discharge: 2019-08-29 | Disposition: A | Discharge status: Home / Self Care | Payer: Federal, State, Local not specified - Other | Attending: Psychiatry | Admitting: Psychiatry

## 2019-08-29 ENCOUNTER — Observation Stay (HOSPITAL_COMMUNITY): Admission: RE | Admit: 2019-08-29 | Payer: Federal, State, Local not specified - Other | Admitting: Psychiatry

## 2019-08-29 DIAGNOSIS — F314 Bipolar disorder, current episode depressed, severe, without psychotic features: Secondary | ICD-10-CM | POA: Insufficient documentation

## 2019-08-29 DIAGNOSIS — F1420 Cocaine dependence, uncomplicated: Secondary | ICD-10-CM | POA: Insufficient documentation

## 2019-08-29 DIAGNOSIS — F419 Anxiety disorder, unspecified: Secondary | ICD-10-CM | POA: Insufficient documentation

## 2019-08-29 DIAGNOSIS — Z915 Personal history of self-harm: Secondary | ICD-10-CM | POA: Insufficient documentation
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
## 2020-01-14 ENCOUNTER — Ambulatory Visit (HOSPITAL_COMMUNITY): Payer: Federal, State, Local not specified - Other | Admitting: Psychiatry

## 2020-02-27 ENCOUNTER — Other Ambulatory Visit: Payer: Self-pay

## 2020-02-27 ENCOUNTER — Ambulatory Visit (INDEPENDENT_AMBULATORY_CARE_PROVIDER_SITE_OTHER): Payer: No Payment, Other | Admitting: Psychiatry

## 2020-02-27 ENCOUNTER — Encounter (HOSPITAL_COMMUNITY): Payer: Self-pay | Admitting: Psychiatry

## 2020-02-27 VITALS — BP 140/101 | HR 116 | Ht 67.0 in | Wt 240.0 lb

## 2020-02-27 DIAGNOSIS — F3175 Bipolar disorder, in partial remission, most recent episode depressed: Secondary | ICD-10-CM

## 2020-02-27 MED ORDER — OXCARBAZEPINE 600 MG PO TABS: 600.0000 mg | ORAL_TABLET | Freq: Two times a day (BID) | ORAL | 1 refills | Status: DC

## 2020-02-27 MED ORDER — BUSPIRONE HCL 15 MG PO TABS: 15.0000 mg | ORAL_TABLET | Freq: Three times a day (TID) | ORAL | 1 refills | Status: DC

## 2020-02-27 MED ORDER — VENLAFAXINE HCL ER 150 MG PO CP24: 150.0000 mg | ORAL_CAPSULE | Freq: Every day | ORAL | 1 refills | Status: DC

## 2020-02-27 MED ORDER — VENLAFAXINE HCL ER 75 MG PO CP24: 75.0000 mg | ORAL_CAPSULE | Freq: Every day | ORAL | 1 refills | Status: DC

## 2020-02-27 MED ORDER — TRAZODONE HCL 150 MG PO TABS: 150.0000 mg | ORAL_TABLET | Freq: Every day | ORAL | 1 refills | Status: DC

## 2020-02-27 MED ORDER — HYDROXYZINE HCL 25 MG PO TABS: 25.0000 mg | ORAL_TABLET | Freq: Three times a day (TID) | ORAL | 1 refills | Status: DC | PRN
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
## 2020-03-09 ENCOUNTER — Telehealth (HOSPITAL_COMMUNITY): Payer: Self-pay | Admitting: *Deleted

## 2020-03-09 NOTE — Telephone Encounter (Signed)
Call from patient stating he remembered Dr Evelene Croon saying he might start him on Wellbutrin. Reviewed his record quickly and no mention of Wellbutrin. He states he is doing "OK" but thought he would be getting it. Told patient I would put in a telephone message but it may have to keep as a conversation when he is next seen in Dec.

## 2020-04-28 ENCOUNTER — Encounter (HOSPITAL_COMMUNITY): Payer: Self-pay | Admitting: Psychiatry

## 2020-04-28 ENCOUNTER — Ambulatory Visit (INDEPENDENT_AMBULATORY_CARE_PROVIDER_SITE_OTHER): Payer: No Payment, Other | Admitting: Psychiatry

## 2020-04-28 ENCOUNTER — Telehealth (HOSPITAL_COMMUNITY): Payer: Self-pay | Admitting: *Deleted

## 2020-04-28 ENCOUNTER — Other Ambulatory Visit: Payer: Self-pay

## 2020-04-28 DIAGNOSIS — F3175 Bipolar disorder, in partial remission, most recent episode depressed: Secondary | ICD-10-CM

## 2020-04-28 MED ORDER — VENLAFAXINE HCL ER 150 MG PO CP24: 150.0000 mg | ORAL_CAPSULE | Freq: Every day | ORAL | 1 refills | Status: DC

## 2020-04-28 MED ORDER — BUPROPION HCL ER (XL) 150 MG PO TB24: 150.0000 mg | ORAL_TABLET | ORAL | 1 refills | Status: DC

## 2020-04-28 MED ORDER — TRAZODONE HCL 100 MG PO TABS: ORAL_TABLET | ORAL | 1 refills | Status: DC

## 2020-04-28 MED ORDER — OXCARBAZEPINE 600 MG PO TABS: 600.0000 mg | ORAL_TABLET | Freq: Two times a day (BID) | ORAL | 1 refills | Status: DC

## 2020-04-28 MED ORDER — VENLAFAXINE HCL ER 75 MG PO CP24: 75.0000 mg | ORAL_CAPSULE | Freq: Every day | ORAL | 1 refills | Status: DC

## 2020-04-28 MED ORDER — HYDROXYZINE HCL 25 MG PO TABS: 25.0000 mg | ORAL_TABLET | Freq: Three times a day (TID) | ORAL | 1 refills | Status: DC | PRN

## 2020-04-28 MED ORDER — BUSPIRONE HCL 15 MG PO TABS: 15.0000 mg | ORAL_TABLET | Freq: Three times a day (TID) | ORAL | 1 refills | Status: DC

## 2020-04-28 NOTE — Telephone Encounter (Signed)
Patient has an elevated BP and pulse, states he does every time he comes. He does not have a PCP. Provided him information for [facility] to get in touch with them to schedule an appt and get a provider.

## 2020-04-28 NOTE — Progress Notes (Signed)
BH OP Progress Note   Patient Identification: Corey Smith MRN:  [id number] Date of Evaluation:  04/28/2020    Chief Complaint:  " I still feel very anxious and have no motivation to do anything."  Visit Diagnosis:    ICD-10-CM   1. Bipolar 1 disorder, depressed, partial remission (HCC)  F31.75 venlafaxine XR (EFFEXOR XR) 75 MG 24 hr capsule    venlafaxine XR (EFFEXOR-XR) 150 MG 24 hr capsule    oxcarbazepine (TRILEPTAL) 600 MG tablet    hydrOXYzine (ATARAX/VISTARIL) 25 MG tablet    busPIRone (BUSPAR) 15 MG tablet    traZODone (DESYREL) 100 MG tablet    buPROPion (WELLBUTRIN XL) 150 MG 24 hr tablet    History of Present Illness: Patient was seen for initial intake 2 months ago.  At that time he was recommended increasing the dose of Effexor and BuSpar as well as addition of Wellbutrin to his regimen. Today, patient reported that despite increasing the dose of BuSpar he still makes hydroxyzine with it 3 times a day.  He reported that the pharmacy only dispensed 150 mg capsule for some reason and did not dispense 75 mg capsule along with it.  He stated that he also did not get Wellbutrin prescription. Writer double checked and realized that writer had to do forgotten to send a prescription for Wellbutrin XL. Writer apologized to the patient regarding this inconvenience and informed him that Clinical research associate will make sure that the prescription is sent today. Patient stated that overall he is doing okay but he still feels he can do better. He stated that his anxiety is still can get unmanageable on some days. When asked regarding his plans for Christmas, he reported that they had an early Christmas this past weekend.  He informed that his grandmother was recently diagnosed with Alzheimer's dementia. Rest of his family is doing well for the most part.  Past Psychiatric History: Bipolar disorder, severe polysubstance abuse, has had several psychiatric hospitalizations in the past.  Previous  Psychotropic Medications: Yes   Substance Abuse History in the last 12 months:  Yes.    Consequences of Substance Abuse: NA  Past Medical History:  Past Medical History:  Diagnosis Date  . ANXIETY 06/16/2010  . Bipolar disorder (HCC)   . DEPRESSION 06/16/2010  . FIBROMYALGIA 06/16/2010  . HYPERLIPIDEMIA 06/16/2010  . HYPERTRIGLYCERIDEMIA 06/16/2010  . SCOLIOSIS, MILD 02/16/2007  . WISDOM TEETH EXTRACTION, HX OF 02/16/2007    Past Surgical History:  Procedure Laterality Date  . WISDOM TOOTH EXTRACTION      Family Psychiatric History: Hx of anxiety in mom  Family History:  Family History  Problem Relation Age of Onset  . Hypertension Other   . Anxiety disorder Other     Social History:   Social History   Socioeconomic History  . Marital status: Divorced    Spouse name: Not on file  . Number of children: Not on file  . Years of education: Not on file  . Highest education level: Not on file  Occupational History  . Not on file  Tobacco Use  . Smoking status: Former Smoker    Packs/day: 1.50    Types: Cigarettes  . Smokeless tobacco: Never Used  Substance and Sexual Activity  . Alcohol use: No  . Drug use: No  . Sexual activity: Not on file  Other Topics Concern  . Not on file  Social History Narrative  . 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, lives with girlfriend, divorced x 1, has 2 children 38 and 49 years old  Allergies:  No Known Allergies  Metabolic Disorder Labs: Lab Results  Component Value Date   HGBA1C 5.7 07/11/2012   No results found for: PROLACTIN Lab Results  Component Value Date   CHOL 203 (H) 07/11/2012   TRIG 131.0 07/11/2012   HDL  39.40 07/11/2012   CHOLHDL 5 07/11/2012   VLDL 26.2 07/11/2012   Lab Results  Component Value Date   TSH 0.549 05/25/2019    Therapeutic Level Labs: No results found for: LITHIUM No results found for: CBMZ No results found for: VALPROATE  Current Medications: Current Outpatient Medications  Medication Sig Dispense Refill  . buPROPion (WELLBUTRIN XL) 150 MG 24 hr tablet Take 1 tablet (150 mg total) by mouth every morning. 30 tablet 1  . busPIRone (BUSPAR) 15 MG tablet Take 1 tablet (15 mg total) by mouth 3 (three) times daily. 90 tablet 1  . hydrOXYzine (ATARAX/VISTARIL) 25 MG tablet Take 1 tablet (25 mg total) by mouth 3 (three) times daily as needed for anxiety. 90 tablet 1  . oxcarbazepine (TRILEPTAL) 600 MG tablet Take 1 tablet (600 mg total) by mouth 2 (two) times daily. 60 tablet 1  . traZODone (DESYREL) 100 MG tablet Take 2 tablets at bedtime 60 tablet 1  . venlafaxine XR (EFFEXOR XR) 75 MG 24 hr capsule Take 1 capsule (75 mg total) by mouth daily with breakfast. 30 capsule 1  . venlafaxine XR (EFFEXOR-XR) 150 MG 24 hr capsule Take 1 capsule (150 mg total) by mouth daily with breakfast. 1 Capsule Daily along with 1 Daily Capsule  50 mg (total 225mg ) 30 capsule 1   No current facility-administered medications for this visit.    Musculoskeletal: Strength & Muscle Tone: within normal limits Gait & Station: normal Patient leans: N/A  Psychiatric Specialty Exam: Review of Systems  Blood pressure (!) 159/112, pulse (!) 130, height 5\' 7"  (1.702 m), weight 247 lb (112 kg), SpO2 98 %.Body mass index is 38.69 kg/m.  General Appearance: Well Groomed  Eye Contact:  Good  Speech:  Clear and Coherent and Normal Rate  Volume:  Normal  Mood:  Depressed  Affect:  Congruent  Thought Process:  Goal Directed and Descriptions of Associations: Intact  Orientation:  Full (Time, Place, and Person)  Thought Content:  Logical  Suicidal Thoughts:  No  Homicidal Thoughts:  No  Memory:   Immediate;   Good Recent;   Good  Judgement:  Fair  Insight:  Fair  Psychomotor Activity:  Normal  Concentration:  Concentration: Good and Attention Span: Good  Recall:  Good  Fund of Knowledge:Good  Language: Good  Akathisia:  Negative  Handed:  Right  AIMS (if indicated):  0  Assets:  Communication Skills Desire for Improvement Financial Resources/Insurance Housing  ADL's:  Intact  Cognition: WNL  Sleep:  Fair   Screenings: AIMS   Flowsheet Row Admission (Discharged) from OP Visit from 05/24/2019 in BEHAVIORAL HEALTH OBSERVATION UNIT  AIMS Total Score 0    AUDIT   Flowsheet Row Admission (Discharged) from OP Visit from 05/24/2019 in BEHAVIORAL HEALTH OBSERVATION UNIT  Alcohol Use Disorder  Identification Test Final Score (AUDIT) 0      Assessment and Plan: Patient stated that overall he is doing okay but feels he can do better.  Will send a prescription for Wellbutrin XL 150 mg to see if that would help his depression symptoms better.  We will also resend the prescriptions for Effexor XR 150 mg and 75 mg capsules so that he can get optimal control of his anxiety symptoms.  1. Bipolar 1 disorder, depressed, partial remission (HCC)  - Increase venlafaxine XR (EFFEXOR XR) 75 MG 24 hr capsule; Take 1 capsule (75 mg total) by mouth daily with breakfast.  Dispense: 30 capsule; Refill: 1 - Increase venlafaxine XR (EFFEXOR-XR) 150 MG 24 hr capsule; Take 1 capsule (150 mg total) by mouth daily with breakfast. 1 Capsule Daily along with 1 Daily Capsule  50 mg (total 225mg )  Dispense: 30 capsule; Refill: 1 - oxcarbazepine (TRILEPTAL) 600 MG tablet; Take 1 tablet (600 mg total) by mouth 2 (two) times daily.  Dispense: 60 tablet; Refill: 1 - hydrOXYzine (ATARAX/VISTARIL) 25 MG tablet; Take 1 tablet (25 mg total) by mouth 3 (three) times daily as needed for anxiety.  Dispense: 90 tablet; Refill: 1 - busPIRone (BUSPAR) 15 MG tablet; Take 1 tablet (15 mg total) by mouth 3 (three) times daily.   Dispense: 90 tablet; Refill: 1 - traZODone (DESYREL) 100 MG tablet; Take 2 tablets at bedtime  Dispense: 60 tablet; Refill: 1 - Start buPROPion (WELLBUTRIN XL) 150 MG 24 hr tablet; Take 1 tablet (150 mg total) by mouth every morning.  Dispense: 30 tablet; Refill: 1    F/up in 6 weeks.   , MD 12/21/20211:17 PM

## 2020-06-10 ENCOUNTER — Ambulatory Visit (HOSPITAL_COMMUNITY): Payer: Federal, State, Local not specified - Other | Admitting: Psychiatry

## 2020-06-24 ENCOUNTER — Telehealth (HOSPITAL_COMMUNITY): Payer: Self-pay | Admitting: *Deleted

## 2020-06-24 DIAGNOSIS — F3175 Bipolar disorder, in partial remission, most recent episode depressed: Secondary | ICD-10-CM

## 2020-06-24 MED ORDER — VENLAFAXINE HCL ER 150 MG PO CP24: 150.0000 mg | ORAL_CAPSULE | Freq: Every day | ORAL | 1 refills | Status: DC

## 2020-06-24 MED ORDER — TRAZODONE HCL 100 MG PO TABS: ORAL_TABLET | ORAL | 1 refills | Status: DC

## 2020-06-24 MED ORDER — BUPROPION HCL ER (XL) 150 MG PO TB24: 150.0000 mg | ORAL_TABLET | ORAL | 1 refills | Status: DC

## 2020-06-24 MED ORDER — BUSPIRONE HCL 15 MG PO TABS: 15.0000 mg | ORAL_TABLET | Freq: Three times a day (TID) | ORAL | 1 refills | Status: DC

## 2020-06-24 MED ORDER — VENLAFAXINE HCL ER 75 MG PO CP24: 75.0000 mg | ORAL_CAPSULE | Freq: Every day | ORAL | 1 refills | Status: DC

## 2020-06-24 MED ORDER — HYDROXYZINE HCL 25 MG PO TABS: 25.0000 mg | ORAL_TABLET | Freq: Three times a day (TID) | ORAL | 1 refills | Status: DC | PRN

## 2020-06-24 MED ORDER — OXCARBAZEPINE 600 MG PO TABS: 600.0000 mg | ORAL_TABLET | Freq: Two times a day (BID) | ORAL | 1 refills | Status: DC

## 2020-06-24 NOTE — Telephone Encounter (Signed)
Rx sent 

## 2020-06-24 NOTE — Addendum Note (Signed)
Addended by: Zena Amos on: 06/24/2020 04:17 PM   Modules accepted: Orders

## 2020-06-24 NOTE — Telephone Encounter (Signed)
Requests sent over from [facility] re refill requests for his Trileptal, Effexor, both 150 mg and 75 mg, Trazodone and Bupropion HCL. He should be out and his next appt is on 07/08/20 with Dr Evelene Croon. Will bring this request to her attention.

## 2020-07-08 ENCOUNTER — Telehealth (HOSPITAL_COMMUNITY): Payer: Federal, State, Local not specified - Other | Admitting: Psychiatry

## 2020-07-08 ENCOUNTER — Telehealth (HOSPITAL_COMMUNITY): Payer: Self-pay | Admitting: Psychiatry

## 2020-07-08 ENCOUNTER — Other Ambulatory Visit: Payer: Self-pay

## 2020-07-08 DIAGNOSIS — F3175 Bipolar disorder, in partial remission, most recent episode depressed: Secondary | ICD-10-CM

## 2020-07-08 MED ORDER — VENLAFAXINE HCL ER 75 MG PO CP24: 75.0000 mg | ORAL_CAPSULE | Freq: Every day | ORAL | 1 refills | Status: DC

## 2020-07-08 MED ORDER — BUSPIRONE HCL 15 MG PO TABS: 15.0000 mg | ORAL_TABLET | Freq: Three times a day (TID) | ORAL | 1 refills | Status: DC

## 2020-07-08 MED ORDER — HYDROXYZINE HCL 25 MG PO TABS: 25.0000 mg | ORAL_TABLET | Freq: Three times a day (TID) | ORAL | 1 refills | Status: DC | PRN

## 2020-07-08 MED ORDER — OXCARBAZEPINE 600 MG PO TABS
600.0000 mg | ORAL_TABLET | Freq: Two times a day (BID) | ORAL | 1 refills | Status: DC
Start: 2020-07-08 — End: 2020-08-25

## 2020-07-08 MED ORDER — TRAZODONE HCL 100 MG PO TABS: ORAL_TABLET | ORAL | 1 refills | Status: DC

## 2020-07-08 MED ORDER — BUPROPION HCL ER (XL) 150 MG PO TB24: 150.0000 mg | ORAL_TABLET | ORAL | 1 refills | Status: DC

## 2020-07-08 MED ORDER — VENLAFAXINE HCL ER 150 MG PO CP24: 150.0000 mg | ORAL_CAPSULE | Freq: Every day | ORAL | 1 refills | Status: DC

## 2020-07-08 NOTE — Telephone Encounter (Signed)
Rx sent 

## 2020-08-25 ENCOUNTER — Ambulatory Visit (INDEPENDENT_AMBULATORY_CARE_PROVIDER_SITE_OTHER): Payer: No Payment, Other | Admitting: Psychiatry

## 2020-08-25 ENCOUNTER — Encounter (HOSPITAL_COMMUNITY): Payer: Self-pay | Admitting: Psychiatry

## 2020-08-25 ENCOUNTER — Other Ambulatory Visit: Payer: Self-pay

## 2020-08-25 DIAGNOSIS — F319 Bipolar disorder, unspecified: Secondary | ICD-10-CM | POA: Diagnosis not present

## 2020-08-25 MED ORDER — BUSPIRONE HCL 15 MG PO TABS: 15.0000 mg | ORAL_TABLET | Freq: Three times a day (TID) | ORAL | 2 refills | Status: DC

## 2020-08-25 MED ORDER — BUPROPION HCL ER (XL) 150 MG PO TB24: 150.0000 mg | ORAL_TABLET | ORAL | 2 refills | Status: DC

## 2020-08-25 MED ORDER — HYDROXYZINE HCL 25 MG PO TABS: 25.0000 mg | ORAL_TABLET | Freq: Three times a day (TID) | ORAL | 2 refills | Status: DC | PRN

## 2020-08-25 MED ORDER — VENLAFAXINE HCL ER 150 MG PO CP24: 150.0000 mg | ORAL_CAPSULE | Freq: Every day | ORAL | 2 refills | Status: DC

## 2020-08-25 MED ORDER — OXCARBAZEPINE 600 MG PO TABS: 600.0000 mg | ORAL_TABLET | Freq: Two times a day (BID) | ORAL | 2 refills | Status: DC

## 2020-08-25 MED ORDER — TRAZODONE HCL 100 MG PO TABS: ORAL_TABLET | ORAL | 2 refills | Status: DC

## 2020-08-25 NOTE — Progress Notes (Signed)
BH OP Progress Note   Patient Identification: Corey Smith MRN:  [id number] Date of Evaluation:  08/25/2020    Chief Complaint:  " I am doing fine."  Visit Diagnosis:    ICD-10-CM   1. Bipolar 1 disorder (HCC)  F31.9 buPROPion (WELLBUTRIN XL) 150 MG 24 hr tablet    busPIRone (BUSPAR) 15 MG tablet    hydrOXYzine (ATARAX/VISTARIL) 25 MG tablet    oxcarbazepine (TRILEPTAL) 600 MG tablet    traZODone (DESYREL) 100 MG tablet    venlafaxine XR (EFFEXOR-XR) 150 MG 24 hr capsule    History of Present Illness: Patient informed that he is doing well.  He informed his mood has been stable.  He stated that he is sleeping well with the help of trazodone.  His anxiety is well controlled. He stated that he has noticed that his libido has declined over the last few months.  He stated that it does not really bother him much but his girlfriend has been complaining. Writer informed him that Effexor could be the reason behind that.  Writer asked him if he would like to try lower dose of Effexor since he was on a lower dose in the past.  Patient was agreeable to try a lower dose to see if that would help. He denies any other complaints at this time.   Past Psychiatric History: Bipolar disorder, severe polysubstance abuse, has had several psychiatric hospitalizations in the past.  Previous Psychotropic Medications: Yes   Substance Abuse History in the last 12 months:  Yes.    Consequences of Substance Abuse: NA  Past Medical History:  Past Medical History:  Diagnosis Date  . ANXIETY 06/16/2010  . Bipolar disorder (HCC)   . DEPRESSION 06/16/2010  . FIBROMYALGIA 06/16/2010  . HYPERLIPIDEMIA 06/16/2010  . HYPERTRIGLYCERIDEMIA 06/16/2010  . SCOLIOSIS, MILD 02/16/2007  . WISDOM TEETH EXTRACTION, HX OF 02/16/2007    Past Surgical History:  Procedure Laterality Date  . WISDOM TOOTH EXTRACTION      Family Psychiatric History: Hx of anxiety in mom  Family History:  Family History  Problem Relation Age  of Onset  . Hypertension Other   . Anxiety disorder Other     Social History:   Social History   Socioeconomic History  . Marital status: Divorced    Spouse name: Not on file  . Number of children: Not on file  . Years of education: Not on file  . Highest education level: Not on file  Occupational History  . Not on file  Tobacco Use  . Smoking status: Former Smoker    Packs/day: 1.50    Types: Cigarettes  . Smokeless tobacco: Never Used  Substance and Sexual Activity  . Alcohol use: No  . Drug use: No  . Sexual activity: Not on file  Other Topics Concern  . Not on file  Social History Narrative  . 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, lives with girlfriend, divorced x 1, has 2 children 41 and 27 years old  Allergies:  No Known Allergies  Metabolic Disorder Labs: Lab Results  Component Value Date   HGBA1C 5.7 07/11/2012   No results  found for: PROLACTIN Lab Results  Component Value Date   CHOL 203 (H) 07/11/2012   TRIG 131.0 07/11/2012   HDL 39.40 07/11/2012   CHOLHDL 5 07/11/2012   VLDL 26.2 07/11/2012   Lab Results  Component Value Date   TSH 0.549 05/25/2019    Therapeutic Level Labs: No results found for: LITHIUM No results found for: CBMZ No results found for: VALPROATE  Current Medications: Current Outpatient Medications  Medication Sig Dispense Refill  . buPROPion (WELLBUTRIN XL) 150 MG 24 hr tablet Take 1 tablet (150 mg total) by mouth every morning. 30 tablet 2  . busPIRone (BUSPAR) 15 MG tablet Take 1 tablet (15 mg total) by mouth 3 (three) times daily. 90 tablet 2  . hydrOXYzine (ATARAX/VISTARIL) 25 MG tablet Take 1 tablet (25 mg total)  by mouth 3 (three) times daily as needed for anxiety. 90 tablet 2  . oxcarbazepine (TRILEPTAL) 600 MG tablet Take 1 tablet (600 mg total) by mouth 2 (two) times daily. 60 tablet 2  . traZODone (DESYREL) 100 MG tablet Take 2 tablets at bedtime 60 tablet 2  . venlafaxine XR (EFFEXOR-XR) 150 MG 24 hr capsule Take 1 capsule (150 mg total) by mouth daily with breakfast. 1 Capsule Daily along with 1 Daily Capsule  50 mg (total 225mg ) 30 capsule 2   No current facility-administered medications for this visit.    Musculoskeletal: Strength & Muscle Tone: within normal limits Gait & Station: normal Patient leans: N/A  Psychiatric Specialty Exam: Review of Systems  There were no vitals taken for this visit.There is no height or weight on file to calculate BMI.  General Appearance: Well Groomed  Eye Contact:  Good  Speech:  Clear and Coherent and Normal Rate  Volume:  Normal  Mood:  Depressed  Affect:  Congruent  Thought Process:  Goal Directed and Descriptions of Associations: Intact  Orientation:  Full (Time, Place, and Person)  Thought Content:  Logical  Suicidal Thoughts:  No  Homicidal Thoughts:  No  Memory:  Immediate;   Good Recent;   Good  Judgement:  Fair  Insight:  Fair  Psychomotor Activity:  Normal  Concentration:  Concentration: Good and Attention Span: Good  Recall:  Good  Fund of Knowledge:Good  Language: Good  Akathisia:  Negative  Handed:  Right  AIMS (if indicated):  0  Assets:  Communication Skills Desire for Improvement Financial Resources/Insurance Housing  ADL's:  Intact  Cognition: WNL  Sleep:  Fair   Screenings: AIMS   Flowsheet Row Admission (Discharged) from OP Visit from 05/24/2019 in [facility]  AIMS Total Score 0    AUDIT   Flowsheet Row Admission (Discharged) from OP Visit from 05/24/2019 in [facility]  Alcohol Use Disorder Identification Test Final Score (AUDIT) 0    Flowsheet Row Admission  (Discharged) from OP Visit from 05/24/2019 in [facility]  C-SSRS RISK CATEGORY No Risk      Assessment and Plan: Patient appears to be stable and in remission of his symptoms.  His dose of Effexor is being reduced to 150 mg daily due to complaints of loss of libido on higher dose.  1. Bipolar 1 disorder (HCC)   -Reduce venlafaxine XR (EFFEXOR-XR) 150 MG 24 hr capsule; Take 1 capsule (150 mg total) by mouth daily with breakfast. 1 Capsule Daily along with 1 Daily Capsule  50 mg (total 225mg )  Dispense: 30 capsule; Refill: 1 - oxcarbazepine (TRILEPTAL) 600 MG tablet; Take 1 tablet (  600 mg total) by mouth 2 (two) times daily.  Dispense: 60 tablet; Refill: 1 - hydrOXYzine (ATARAX/VISTARIL) 25 MG tablet; Take 1 tablet (25 mg total) by mouth 3 (three) times daily as needed for anxiety.  Dispense: 90 tablet; Refill: 1 - busPIRone (BUSPAR) 15 MG tablet; Take 1 tablet (15 mg total) by mouth 3 (three) times daily.  Dispense: 90 tablet; Refill: 1 - traZODone (DESYREL) 100 MG tablet; Take 2 tablets at bedtime  Dispense: 60 tablet; Refill: 1 - buPROPion (WELLBUTRIN XL) 150 MG 24 hr tablet; Take 1 tablet (150 mg total) by mouth every morning.  Dispense: 30 tablet; Refill: 1   Dose of Effexor XR was reduced back to 150 mg daily due to loss of libido on higher dose. Continue rest of the medication regimen. Follow up in 3 months.    Zena Amos, MD 4/19/20221:54 PM

## 2020-10-20 ENCOUNTER — Ambulatory Visit (HOSPITAL_COMMUNITY): Payer: Self-pay | Admitting: Physician Assistant

## 2020-10-26 ENCOUNTER — Telehealth (HOSPITAL_COMMUNITY): Payer: Self-pay | Admitting: *Deleted

## 2020-10-26 ENCOUNTER — Other Ambulatory Visit (HOSPITAL_COMMUNITY): Payer: Self-pay | Admitting: Physician Assistant

## 2020-10-26 DIAGNOSIS — F319 Bipolar disorder, unspecified: Secondary | ICD-10-CM

## 2020-10-26 MED ORDER — VENLAFAXINE HCL ER 150 MG PO CP24
150.0000 mg | ORAL_CAPSULE | Freq: Every day | ORAL | 2 refills | Status: DC
Start: 2020-10-26 — End: 2020-11-09

## 2020-10-26 NOTE — Telephone Encounter (Signed)
Dose of Effexor XR was reduced to 150 mg daily due to his complaint of loss of libido on higher dose at the time of his last appt with me on 08/25/20.  He is to continue Effexor XR 150 mg daily.

## 2020-10-26 NOTE — Telephone Encounter (Signed)
Provider was contacted by Corey Smith, RMA regarding patient's medication refill. Patient's medication to be e-prescribed to pharmacy of choice.

## 2020-10-26 NOTE — Telephone Encounter (Signed)
Pharmacy requesting refill of Effexor 75mg . Patient has a refill on 150 but not 75.

## 2020-10-26 NOTE — Telephone Encounter (Signed)
Rx Refill Request venlafaxine XR (EFFEXOR-XR) 150 MG 24 hr capsule Next Appt. 11/05/20

## 2020-10-26 NOTE — Telephone Encounter (Signed)
Rx Refill Request venlafaxine XR (EFFEXOR-XR) 150 MG 24 hr capsule Next Appt. 11/05/20 

## 2020-10-26 NOTE — Progress Notes (Signed)
Provider was contacted by Direce E McIntyre, RMA regarding patient's medication refill. Patient's medication to be e-prescribed to pharmacy of choice.

## 2020-10-26 NOTE — Telephone Encounter (Signed)
Gotcha!

## 2020-11-05 ENCOUNTER — Telehealth (INDEPENDENT_AMBULATORY_CARE_PROVIDER_SITE_OTHER): Payer: No Payment, Other | Admitting: Physician Assistant

## 2020-11-05 ENCOUNTER — Encounter (HOSPITAL_COMMUNITY): Payer: Self-pay | Admitting: Physician Assistant

## 2020-11-05 DIAGNOSIS — F319 Bipolar disorder, unspecified: Secondary | ICD-10-CM | POA: Diagnosis not present

## 2020-11-05 NOTE — Progress Notes (Signed)
BH MD/PA/NP OP Progress Note  Virtual Visit via Telephone Note  I connected with Corey Smith on 11/05/20 at  1:00 PM EDT by telephone and verified that I am speaking with the correct person using two identifiers.  Location: Patient: Private area at work Provider: Clinic   I discussed the limitations, risks, security and privacy concerns of performing an evaluation and management service by telephone and the availability of in person appointments. I also discussed with the patient that there may be a patient responsible charge related to this service. The patient expressed understanding and agreed to proceed.  Follow Up Instructions:  I discussed the assessment and treatment plan with the patient. The patient was provided an opportunity to ask questions and all were answered. The patient agreed with the plan and demonstrated an understanding of the instructions.   The patient was advised to call back or seek an in-person evaluation if the symptoms worsen or if the condition fails to improve as anticipated.  I provided 20 minutes of non-face-to-face time during this encounter.  Meta Hatchet, PA   11/05/2020 1:38 PM Corey Smith  MRN:  [id number]  Chief Complaint: Follow up and medication management  HPI:   Corey Smith is a 35 year old male with a past psychiatric history significant for bipolar I disorder presents to [facility] via virtual telephone visit for follow-up and medication management.  Patient is currently being managed on the following medications:  Effexor XR 225 mg daily Oxcarbazepine 600 mg 2 times daily Hydroxyzine 25 mg 3 times daily as needed Buspirone 15 mg 3 times daily Trazodone 100 mg at bedtime Bupropion (Wellbutrin XL) 150 mg 24-hour tablet daily  Patient reports no issues or concerns regarding his current medication regimen.  Patient denies the need for dosage adjustments at this time and is requesting  refills on all of his medications following the conclusion of the encounter.  Patient denies experiencing any depressive symptoms at this time.  Patient states that his anxiety has been manageable and rates his anxiety a 4 out of 10.  Patient denies any new stressors or major life changing events.  Patient denies any other issues or concerns regarding his mental health.  A PHQ-9 screen was performed with the patient scoring an 8.  A GAD-7 screen was also performed with the patient scoring a 7.  Patient is pleasant, calm, cooperative, and fully engaged in conversation during the encounter.  Patient reports that he is doing pretty good, he is happy, and has no worries.  Patient denies suicidal or homicidal ideations.  He further denies auditory or visual hallucinations and does not appear to be responding to internal/external stimuli.  Patient endorses good sleep and receives on average 8 hours of sleep each night.  Patient endorses good appetite and eats on average 3 meals per day.  Patient says alcohol consumption sparingly.  Patient endorses tobacco use and smokes on average a pack per day.  Patient denies illicit drug use.  Visit Diagnosis:    ICD-10-CM   1. Bipolar 1 disorder (HCC)  F31.9 traZODone (DESYREL) 100 MG tablet    busPIRone (BUSPAR) 15 MG tablet    buPROPion (WELLBUTRIN XL) 150 MG 24 hr tablet    venlafaxine XR (EFFEXOR-XR) 150 MG 24 hr capsule    hydrOXYzine (ATARAX/VISTARIL) 25 MG tablet    oxcarbazepine (TRILEPTAL) 600 MG tablet      Past Psychiatric History:  Bipolar disorder Severe polysubstance abuse Patient has had  several psychiatric hospitalizations in the past.  Past Medical History:  Past Medical History:  Diagnosis Date   ANXIETY 06/16/2010   Bipolar disorder (HCC)    DEPRESSION 06/16/2010   FIBROMYALGIA 06/16/2010   HYPERLIPIDEMIA 06/16/2010   HYPERTRIGLYCERIDEMIA 06/16/2010   SCOLIOSIS, MILD 02/16/2007   WISDOM TEETH EXTRACTION, HX OF 02/16/2007    Past Surgical  History:  Procedure Laterality Date   WISDOM TOOTH EXTRACTION      Family Psychiatric History:  Patient's mother has a history of anxiety  Family History:  Family History  Problem Relation Age of Onset   Hypertension Other    Anxiety disorder Other     Social History:  Social History   Socioeconomic History   Marital status: Divorced    Spouse name: Not on file   Number of children: Not on file   Years of education: Not on file   Highest education level: Not on file  Occupational History   Not on file  Tobacco Use   Smoking status: Former    Packs/day: 1.50    Pack years: 0.00    Types: Cigarettes   Smokeless tobacco: Never  Substance and Sexual Activity   Alcohol use: No   Drug use: No   Sexual activity: Not on file  Other Topics Concern   Not on file  Social History Narrative   Not on file   Social Determinants of Health   Financial Resource Strain: Not on file  Food Insecurity: Not on file  Transportation Needs: Not on file  Physical Activity: Not on file  Stress: Not on file  Social Connections: Not on file    Allergies: No Known Allergies  Metabolic Disorder Labs: Lab Results  Component Value Date   HGBA1C 5.7 07/11/2012   No results found for: PROLACTIN Lab Results  Component Value Date   CHOL 203 (H) 07/11/2012   TRIG 131.0 07/11/2012   HDL 39.40 07/11/2012   CHOLHDL 5 07/11/2012   VLDL 26.2 07/11/2012   Lab Results  Component Value Date   TSH 0.549 05/25/2019   TSH 0.61 07/11/2012    Therapeutic Level Labs: No results found for: LITHIUM No results found for: VALPROATE No components found for:  CBMZ  Current Medications: Current Outpatient Medications  Medication Sig Dispense Refill   buPROPion (WELLBUTRIN XL) 150 MG 24 hr tablet Take 1 tablet (150 mg total) by mouth every morning. 30 tablet 2   busPIRone (BUSPAR) 15 MG tablet Take 1 tablet (15 mg total) by mouth 3 (three) times daily. 90 tablet 2   hydrOXYzine  (ATARAX/VISTARIL) 25 MG tablet Take 1 tablet (25 mg total) by mouth 3 (three) times daily as needed for anxiety. 90 tablet 2   oxcarbazepine (TRILEPTAL) 600 MG tablet Take 1 tablet (600 mg total) by mouth 2 (two) times daily. 60 tablet 2   traZODone (DESYREL) 100 MG tablet Take 2 tablets at bedtime 60 tablet 2   venlafaxine XR (EFFEXOR-XR) 150 MG 24 hr capsule Take 1 capsule (150 mg total) by mouth daily with breakfast. 1 Capsule Daily along with 1 Daily Capsule  50 mg (total 225mg ) 30 capsule 2   No current facility-administered medications for this visit.     Musculoskeletal: Strength & Muscle Tone: Unable to assess due to telemedicine visit Gait & Station: Unable to assess due to telemedicine visit Patient leans: Unable to assess due to telemedicine visit  Psychiatric Specialty Exam: Review of Systems  Psychiatric/Behavioral:  Negative for decreased concentration, dysphoric mood, hallucinations, self-injury,  sleep disturbance and suicidal ideas. The patient is nervous/anxious. The patient is not hyperactive.    There were no vitals taken for this visit.There is no height or weight on file to calculate BMI.  General Appearance: Unable to assess due to telemedicine visit  Eye Contact:  Unable to assess due to telemedicine visit  Speech:  Clear and Coherent and Normal Rate  Volume:  Normal  Mood:  Anxious and Euthymic  Affect:  Appropriate and Congruent  Thought Process:  Coherent and Descriptions of Associations: Intact  Orientation:  Full (Time, Place, and Person)  Thought Content: WDL   Suicidal Thoughts:  No  Homicidal Thoughts:  No  Memory:  Immediate;   Good Recent;   Fair Remote;   Fair  Judgement:  Fair  Insight:  Fair  Psychomotor Activity:  Normal  Concentration:  Concentration: Good and Attention Span: Good  Recall:  Good  Fund of Knowledge: Good  Language: Good  Akathisia:  Negative  Handed:  Right  AIMS (if indicated): not done  Assets:  Communication  Skills Desire for Improvement Financial Resources/Insurance Housing  ADL's:  Intact  Cognition: WNL  Sleep:  Good   Screenings: AIMS    Flowsheet Row Admission (Discharged) from OP Visit from 05/24/2019 in [facility]IT  AIMS Total Score 0      AUDIT    Flowsheet Row Admission (Discharged) from OP Visit from 05/24/2019 in [facility]  Alcohol Use Disorder Identification Test Final Score (AUDIT) 0      GAD-7    Flowsheet Row Video [facility] from 11/05/2020 [facility]  Total GAD-7 Score 7      PHQ2-9    Flowsheet Row Video Visit from 11/05/2020 in [facility]  PHQ-2 Total Score 2  PHQ-9 Total Score 8      Flowsheet Row Video Visit from 11/05/2020 in [facility] Admission (Discharged) from OP Visit from 05/24/2019 in BEHAVIORAL HEALTH OBSERVATION UNIT  C-SSRS RISK CATEGORY No Risk No Risk        Assessment and Plan:   Javell Blackburn is a 35 year old male with a past psychiatric history significant for bipolar I disorder presents to [facility] via virtual telephone visit for follow-up and medication management.  Patient reports no issues or concerns regarding his current medications.  Patient denied the need for dosage adjustments at this time and is requesting refills on his medications following the conclusion of the encounter.  Patient's medications to be e-prescribed to pharmacy of choice.  Patient denies wanting to receive counseling/therapy services.  1. Bipolar 1 disorder (HCC)  - traZODone (DESYREL) 100 MG tablet; Take 2 tablets at bedtime  Dispense: 60 tablet; Refill: 2 - busPIRone (BUSPAR) 15 MG tablet; Take 1 tablet (15 mg total) by mouth 3 (three) times daily.  Dispense: 90 tablet; Refill: 2 - buPROPion (WELLBUTRIN XL) 150 MG 24 hr tablet; Take 1 tablet (150 mg total) by mouth every morning.   Dispense: 30 tablet; Refill: 2 - venlafaxine XR (EFFEXOR-XR) 150 MG 24 hr capsule; Take 1 capsule (150 mg total) by mouth daily with breakfast. 1 Capsule Daily along with 1 Daily Capsule  50 mg (total 225mg )  Dispense: 30 capsule; Refill: 2 - hydrOXYzine (ATARAX/VISTARIL) 25 MG tablet; Take 1 tablet (25 mg total) by mouth 3 (three) times daily as needed for anxiety.  Dispense: 90 tablet; Refill: 2 - oxcarbazepine (TRILEPTAL) 600 MG tablet; Take  1 tablet (600 mg total) by mouth 2 (two) times daily.  Dispense: 60 tablet; Refill: 2  Patient to follow-up in 3 months Provider spent a total of 20 minutes with the patient/reviewing patient's chart  Meta HatchetUchenna E Jonne Rote, PA 11/05/2020, 1:38 PM

## 2020-11-09 MED ORDER — TRAZODONE HCL 100 MG PO TABS: ORAL_TABLET | ORAL | 2 refills | Status: DC

## 2020-11-09 MED ORDER — HYDROXYZINE HCL 25 MG PO TABS: 25.0000 mg | ORAL_TABLET | Freq: Three times a day (TID) | ORAL | 2 refills | Status: DC | PRN

## 2020-11-09 MED ORDER — OXCARBAZEPINE 600 MG PO TABS: 600.0000 mg | ORAL_TABLET | Freq: Two times a day (BID) | ORAL | 2 refills | Status: DC

## 2020-11-09 MED ORDER — BUPROPION HCL ER (XL) 150 MG PO TB24
150.0000 mg | ORAL_TABLET | ORAL | 2 refills | Status: DC
Start: 2020-11-09 — End: 2021-04-27

## 2020-11-09 MED ORDER — BUSPIRONE HCL 15 MG PO TABS: 15.0000 mg | ORAL_TABLET | Freq: Three times a day (TID) | ORAL | 2 refills | Status: DC

## 2020-11-09 MED ORDER — VENLAFAXINE HCL ER 150 MG PO CP24: 150.0000 mg | ORAL_CAPSULE | Freq: Every day | ORAL | 2 refills | Status: DC

## 2021-02-04 ENCOUNTER — Telehealth (HOSPITAL_COMMUNITY): Payer: Self-pay | Admitting: Physician Assistant

## 2021-04-26 ENCOUNTER — Other Ambulatory Visit (HOSPITAL_COMMUNITY): Payer: Self-pay | Admitting: Physician Assistant

## 2021-04-26 DIAGNOSIS — F319 Bipolar disorder, unspecified: Secondary | ICD-10-CM

## 2021-04-28 ENCOUNTER — Ambulatory Visit (HOSPITAL_COMMUNITY)
Admission: EM | Admit: 2021-04-28 | Discharge: 2021-04-28 | Disposition: A | Discharge status: Home / Self Care | Payer: No Payment, Other | Attending: Nurse Practitioner | Admitting: Nurse Practitioner

## 2021-04-28 DIAGNOSIS — F5105 Insomnia due to other mental disorder: Secondary | ICD-10-CM | POA: Insufficient documentation

## 2021-04-28 DIAGNOSIS — Z79899 Other long term (current) drug therapy: Secondary | ICD-10-CM | POA: Insufficient documentation

## 2021-04-28 DIAGNOSIS — F319 Bipolar disorder, unspecified: Secondary | ICD-10-CM

## 2021-04-28 DIAGNOSIS — Z760 Encounter for issue of repeat prescription: Secondary | ICD-10-CM | POA: Insufficient documentation

## 2021-04-28 DIAGNOSIS — F3162 Bipolar disorder, current episode mixed, moderate: Secondary | ICD-10-CM | POA: Insufficient documentation

## 2021-04-28 MED ORDER — BUSPIRONE HCL 15 MG PO TABS: 15.0000 mg | ORAL_TABLET | Freq: Three times a day (TID) | ORAL | 0 refills | Status: DC

## 2021-04-28 MED ORDER — VENLAFAXINE HCL ER 150 MG PO CP24: 150.0000 mg | ORAL_CAPSULE | Freq: Every day | ORAL | 0 refills | Status: DC

## 2021-04-28 MED ORDER — OXCARBAZEPINE 600 MG PO TABS: 600.0000 mg | ORAL_TABLET | Freq: Two times a day (BID) | ORAL | 0 refills | Status: DC

## 2021-04-28 MED ORDER — HYDROXYZINE HCL 25 MG PO TABS: 25.0000 mg | ORAL_TABLET | Freq: Three times a day (TID) | ORAL | 0 refills | Status: DC | PRN

## 2021-04-28 MED ORDER — BUPROPION HCL ER (XL) 150 MG PO TB24: 150.0000 mg | ORAL_TABLET | ORAL | 0 refills | Status: DC

## 2021-04-28 MED ORDER — TRAZODONE HCL 100 MG PO TABS: ORAL_TABLET | ORAL | 0 refills | Status: DC

## 2021-04-28 NOTE — ED Provider Notes (Signed)
Behavioral [facility] Urgent Care Medical Screening Exam  Patient Name: Corey Smith MRN: [id number] Date of Evaluation: 04/28/21 Chief Complaint:   Diagnosis:  Final diagnoses:  Moderate mixed bipolar I disorder (HCC)  Insomnia due to other mental disorder    History of Present illness: Corey Smith is a 35 y.o. male with a history of bipolar I disorder. Patient is followed by Karel Jarvis, PA-C at [facility]. Patient states that he has not been sleeping and is out of his trazodone. Patient states that he is almost out of his other medications and needs refills. Patient dates that he feels that the medications work well and he does not need medication changes. Patient endorses mild depression and anxiety. Other than insomnia, the patient denies current symptoms of mania or hypomania, including euphoric mood, increased activity, impulsive/reckless behavior, racing thoughts, increased talkativeness, marked inability to focus.  Patient reports that he is currently being managed on the following medications:  Effexor XR 225 mg daily Oxcarbazepine 600 mg 2 times daily Hydroxyzine 25 mg 3 times daily as needed Buspirone 15 mg 3 times daily Trazodone 100 mg at bedtime Bupropion (Wellbutrin XL) 150 mg 24-hour tablet daily  On evaluation patient is alert and oriented x 4, pleasant, and cooperative. Speech is clear and coherent. Mood is depressed/anxious and affect is congruent with mood. Thought process is coherent and thought content is logical. Denies auditory and visual hallucinations. No indication that patient is responding to internal stimuli. No evidence of delusional thought content. Denies suicidal ideations. Denies homicidal ideations. Denies substance abuse.       Psychiatric Specialty Exam  Presentation  General Appearance:Appropriate for Environment; Neat  Eye Contact:Good  Speech:Clear and Coherent; Normal Rate  Speech  Volume:Normal  Handedness:No data recorded  Mood and Affect  Mood:Depressed; Anxious  Affect:Congruent   Thought Process  Thought Processes:Coherent; Goal Directed; Linear  Descriptions of Associations:Intact  Orientation:Full (Time, Place and Person)  Thought Content:Logical    Hallucinations:None  Ideas of Reference:None  Suicidal Thoughts:No  Homicidal Thoughts:No   Sensorium  Memory:Immediate Good; Recent Good; Remote Good  Judgment:Fair  Insight:Fair   Executive Functions  Concentration:Good  Attention Span:Good  Recall:Good  Fund of Knowledge:Good  Language:Good   Psychomotor Activity  Psychomotor Activity:Normal   Assets  Assets:Communication Skills; Desire for Improvement; Physical Health; Housing   Sleep  Sleep:Poor  Number of hours: No data recorded  No data recorded  Physical Exam: Physical Exam Constitutional:      General: He is not in acute distress.    Appearance: He is not ill-appearing, toxic-appearing or diaphoretic.  HENT:     Head: Normocephalic.     Right Ear: External ear normal.     Left Ear: External ear normal.  Eyes:     Pupils: Pupils are equal, round, and reactive to light.  Cardiovascular:     Rate and Rhythm: Normal rate.  Pulmonary:     Effort: Pulmonary effort is normal. No respiratory distress.  Musculoskeletal:        General: Normal range of motion.  Skin:    General: Skin is warm and dry.  Neurological:     Mental Status: He is alert and oriented to person, place, and time.  Psychiatric:        Mood and Affect: Mood is anxious and depressed.        Speech: Speech normal.        Behavior: Behavior is cooperative.  Thought Content: Thought content is not paranoid or delusional. Thought content does not include homicidal or suicidal ideation. Thought content does not include suicidal plan.   Review of Systems  Constitutional:  Negative for chills, diaphoresis, fever, malaise/fatigue and  weight loss.  HENT:  Negative for congestion.   Respiratory:  Negative for cough and shortness of breath.   Cardiovascular:  Negative for chest pain and palpitations.  Gastrointestinal:  Negative for diarrhea, nausea and vomiting.  Neurological:  Negative for dizziness and seizures.  Psychiatric/Behavioral:  Positive for depression. Negative for hallucinations, memory loss, substance abuse and suicidal ideas. The patient is nervous/anxious and has insomnia.   All other systems reviewed and are negative.  Blood pressure (!) 146/87, pulse (!) 116, temperature 98.2 F (36.8 C), temperature source Oral, resp. rate 18, SpO2 99 %. There is no height or weight on file to calculate BMI.  Musculoskeletal: Strength & Muscle Tone: within normal limits Gait & Station: normal Patient leans: N/A  Labs: No results found for this or any previous visit (from the past 2160 hour(s)).   Current Meds  Medication Sig   [DISCONTINUED] buPROPion (WELLBUTRIN XL) 150 MG 24 hr tablet TAKE 1 TABLET (150 MG TOTAL) BY MOUTH EVERY MORNING.   [DISCONTINUED] busPIRone (BUSPAR) 15 MG tablet Take 1 tablet (15 mg total) by mouth 3 (three) times daily.   [DISCONTINUED] hydrOXYzine (ATARAX/VISTARIL) 25 MG tablet Take 1 tablet (25 mg total) by mouth 3 (three) times daily as needed for anxiety.   [DISCONTINUED] oxcarbazepine (TRILEPTAL) 600 MG tablet TAKE 1 TABLET BY MOUTH TWICE A DAY   [DISCONTINUED] traZODone (DESYREL) 100 MG tablet TAKE 2 TABLETS BY MOUTH EVERY NIGHT AT BEDTIME   [DISCONTINUED] venlafaxine XR (EFFEXOR-XR) 150 MG 24 hr capsule Take 1 capsule (150 mg total) by mouth daily with breakfast. 1 Capsule Daily along with 1 Daily Capsule  50 mg (total 225mg )     [facility] MSE Discharge Disposition for Follow up and Recommendations: Based on my evaluation the patient does not appear to have an emergency medical condition and can be discharged with resources and follow up care in outpatient services for Medication  Management and Individual Therapy  Patient verbalizes understanding of following up with outpatient psychiatric provider. Patient provided with walk-in hours at [facility].  Meds ordered this encounter  Medications   buPROPion (WELLBUTRIN XL) 150 MG 24 hr tablet    Sig: Take 1 tablet (150 mg total) by mouth every morning.    Dispense:  30 tablet    Refill:  0    Order Specific Question:   Supervising Provider    Answer:   [facility] [[id number]]   busPIRone (BUSPAR) 15 MG tablet    Sig: Take 1 tablet (15 mg total) by mouth 3 (three) times daily.    Dispense:  90 tablet    Refill:  0    Order Specific Question:   Supervising Provider    Answer:   Nelly Rout [[id number]]   hydrOXYzine (ATARAX) 25 MG tablet    Sig: Take 1 tablet (25 mg total) by mouth 3 (three) times daily as needed for anxiety.    Dispense:  60 tablet    Refill:  0    Order Specific Question:   Supervising Provider    Answer:   Nelly Rout [[id number]]   traZODone (DESYREL) 100 MG tablet    Sig: TAKE 2 TABLETS BY MOUTH EVERY NIGHT AT BEDTIME    Dispense:  60 tablet    Refill:  0  Order Specific Question:   Supervising Provider    Answer:   Nelly Rout [[id number]]   venlafaxine XR (EFFEXOR-XR) 150 MG 24 hr capsule    Sig: Take 1 capsule (150 mg total) by mouth daily with breakfast. 1 Capsule Daily along with 1 Daily Capsule  50 mg (total 225mg )    Dispense:  30 capsule    Refill:  0    Order Specific Question:   Supervising Provider    Answer:   [[id number]]   oxcarbazepine (TRILEPTAL) 600 MG tablet    Sig: Take 1 tablet (600 mg total) by mouth 2 (two) times daily.    Dispense:  60 tablet    Refill:  0    Order Specific Question:   Supervising Provider    Answer:   Nelly Rout [[id number]]      Nelly Rout, NP 04/28/2021, 5:31 AM

## 2021-04-28 NOTE — Progress Notes (Signed)
TRIAGE: ROUTINE  Lindon Romp, NP, reviewed pt's chart and information and met with pt face-to-face and determined pt does not meet inpatient criteria.   04/28/21 0609  [place] Triage Screening (Walk-ins at [facility] only)  How Did You Hear About [place]? Self  What Is the Reason for Your Visit/Call Today? Pt states he has been having difficulties sleeping; he states he had an appt scheduled with Dr. Toy Care in September but that he canceled it and has been unable to re-schedule it since that time due to work. Pt states he is prescribed 6 mecications, 3 of which he ran out of 2 days ago. Pt states he ran out of Trazadone, Wellbutrin, and Triliptal; he states he has been unable to sleep due to this. Pt denies SI, though he acknowledges he experienced SI "a long time ago." Pt denies he'sw ever attempted to kill himself or that he has a plan to kill himself. He said he was observed overnight almost 2 years ago, though he states that was when he was using illegal substances, which he denies he currently uses. Pt denies HI, AVH, NSSIB, access to guns/weapons, or engagement with the legal system.  How Long Has This Been Causing You Problems? <Week  Have You Recently Had Any Thoughts About Hurting Yourself? No  Are You Planning to Commit Suicide/Harm Yourself At This time? No  Have you Recently Had Thoughts About [place]? No  Are You Planning To Harm Someone At This Time? No  Are you currently experiencing any auditory, visual or other hallucinations? No  Have You Used Any Alcohol or Drugs in the Past 24 Hours? No  Do you have any current medical co-morbidities that require immediate attention? No  Clinician description of patient physical appearance/behavior: Pt is dressed in age-appropriate attire. He is able to express his thoughts, feelings, and concerns. Pt answers the questions posed.  What Do You Feel Would Help You the Most Today? Medication(s)  If access to [facility] was not available, would you  have sought care in the Emergency Department? No  Determination of Need Routine (7 days)  Options For Referral Medication Management

## 2021-04-28 NOTE — Discharge Instructions (Addendum)
Therapy Walk-in Hours  Monday-Wednesday: 8 AM until slots are full  Friday: 1 PM to 5 PM  For Monday to Wednesday, it is recommended that patients arrive between 7:30 AM and 7:45 AM because patients will be seen in the order of arrival.  For Friday, we ask that patients arrive between 12 PM to 12:30 PM.  Go to the second floor on arrival and check in.  **Availability is limited; therefore, patients may not be seen on the same day.**  Medication management walk-ins:  Monday to Friday: 8 AM to 11 AM.  It is recommended that patients arrive by 7:30 AM to 7:45 AM because patients will be seen in the order of arrival.  Go to the second floor on arrival and check in.  **Availability is limited; therefore, patients may not be seen on the same day.**    _________________________________   Discharge recommendations:  Patient is to take medications as prescribed. Please see information for follow-up appointment with psychiatry and therapy. Please follow up with your primary care provider for all medical related needs.   Therapy: We recommend that patient participate in individual therapy to address mental health concerns.  Medications: The patient is to contact a medical professional and/or outpatient provider to address any new side effects that develop. Patient should update outpatient providers of any new medications and/or medication changes.   Atypical antipsychotics: If you are prescribed an atypical antipsychotic, it is recommended that your height, weight, BMI, blood pressure, fasting lipid panel, and fasting blood sugar be monitored by your outpatient providers.  Safety:  The patient should abstain from use of illicit substances/drugs and abuse of any medications. If symptoms worsen or do not continue to improve or if the patient becomes actively suicidal or homicidal then it is recommended that the patient return to the closest hospital emergency department, the [facility], or call 911 for further evaluation and treatment. National Suicide Prevention Lifeline 1-800-SUICIDE or 1-800-273-8255.  About 988 988 offers 24/7 access to trained crisis counselors who can help people experiencing mental health-related distress. People can call or text 988 or chat 988lifeline.org for themselves or if they are worried about a loved one who may need crisis support.  

## 2021-05-10 ENCOUNTER — Telehealth (HOSPITAL_COMMUNITY): Payer: Self-pay | Admitting: Emergency Medicine

## 2021-05-10 NOTE — BH Assessment (Signed)
Care Management - [facility] Follow Up Discharges   Writer attempted to make contact with patient today and was unsuccessful.  Writer left a HIPPA compliant voice message.   Mail box is full.  Per chart review, patient will follow up with his established provider Dr. Evelene Croon.

## 2021-06-22 ENCOUNTER — Telehealth (INDEPENDENT_AMBULATORY_CARE_PROVIDER_SITE_OTHER): Payer: No Payment, Other | Admitting: Physician Assistant

## 2021-06-22 ENCOUNTER — Encounter (HOSPITAL_COMMUNITY): Payer: Self-pay | Admitting: Physician Assistant

## 2021-06-22 DIAGNOSIS — F319 Bipolar disorder, unspecified: Secondary | ICD-10-CM | POA: Diagnosis not present

## 2021-06-22 MED ORDER — BUSPIRONE HCL 15 MG PO TABS: 15.0000 mg | ORAL_TABLET | Freq: Three times a day (TID) | ORAL | 2 refills | Status: DC

## 2021-06-22 MED ORDER — HYDROXYZINE HCL 25 MG PO TABS: 25.0000 mg | ORAL_TABLET | Freq: Three times a day (TID) | ORAL | 2 refills | Status: DC | PRN

## 2021-06-22 MED ORDER — VENLAFAXINE HCL ER 150 MG PO CP24: 150.0000 mg | ORAL_CAPSULE | Freq: Every day | ORAL | 2 refills | Status: DC

## 2021-06-22 MED ORDER — TRAZODONE HCL 100 MG PO TABS: ORAL_TABLET | ORAL | 2 refills | Status: DC

## 2021-06-22 MED ORDER — OXCARBAZEPINE 600 MG PO TABS: 600.0000 mg | ORAL_TABLET | Freq: Two times a day (BID) | ORAL | 2 refills | Status: DC

## 2021-06-22 MED ORDER — VENLAFAXINE HCL ER 75 MG PO CP24: 75.0000 mg | ORAL_CAPSULE | Freq: Every day | ORAL | 2 refills | Status: DC

## 2021-06-22 MED ORDER — BUPROPION HCL ER (XL) 150 MG PO TB24: 150.0000 mg | ORAL_TABLET | ORAL | 2 refills | Status: DC

## 2021-06-22 NOTE — Progress Notes (Addendum)
BH MD/PA/NP OP Progress Note  Virtual Visit via Telephone Note  I connected with Corey Smith on 06/22/21 at  8:00 AM EST by telephone and verified that I am speaking with the correct person using two identifiers.  Location: Patient: Home Provider: Clinic   I discussed the limitations, risks, security and privacy concerns of performing an evaluation and management service by telephone and the availability of in person appointments. I also discussed with the patient that there may be a patient responsible charge related to this service. The patient expressed understanding and agreed to proceed.  Follow Up Instructions:   I discussed the assessment and treatment plan with the patient. The patient was provided an opportunity to ask questions and all were answered. The patient agreed with the plan and demonstrated an understanding of the instructions.   The patient was advised to call back or seek an in-person evaluation if the symptoms worsen or if the condition fails to improve as anticipated.  I provided 10 minutes of non-face-to-face time during this encounter.  Malachy Mood, PA    06/22/2021 9:04 AM Corey Smith  MRN:  [id number]  Chief Complaint:  Chief Complaint  Patient presents with   Follow-up   Medication Refill   HPI:   Corey Smith is a 36 year old male with a past psychiatric history significant for bipolar 1 disorder who presents to [facility] via virtual telephone visit for follow-up and medication management.  Patient is currently being managed on the following medications:  Venlafaxine XR (Effexor XR) 225 mg daily Oxcarbazepine 600 mg 2 times daily Hydroxyzine 25 mg 3 times daily as needed Buspirone 15 mg 3 times daily Trazodone 100 mg at bedtime Bupropion (Wellbutrin XL) 150 mg 24-hour tablet daily  Patient reports no issues or concerns regarding his current medication regimen.  Patient denies the need for  dosage adjustments at this time and is requesting refills on all his medications following the conclusion of the encounter.  Patient denies experiencing any depressive symptoms.  He endorses anxiety but states that it is manageable and under control at the moment.  Patient rates his anxiety a 5 out of 10 and denies any new stressors at this time.  Patient reports no other issues or concerns regarding his mental health.  A GAD-7 screen was performed with the patient scoring a 4.  Patient is alert and oriented x4, calm, cooperative, and fully engaged in conversation during the encounter.  Patient endorses good mood.  Patient denies suicidal or homicidal ideations.  He further denies auditory or visual hallucinations and does not appear to be responding to internal/external stimuli.  Patient endorses good sleep and receives on average 8 hours of sleep each night.  Patient endorses good appetite and eats on average 3 meals per day.  Patient endorses alcohol consumption sparingly.  Patient endorses tobacco use and smokes on average a pack per day.  Patient denies illicit drug use.  Visit Diagnosis:    ICD-10-CM   1. Bipolar 1 disorder (HCC)  F31.9 traZODone (DESYREL) 100 MG tablet    busPIRone (BUSPAR) 15 MG tablet    buPROPion (WELLBUTRIN XL) 150 MG 24 hr tablet    venlafaxine XR (EFFEXOR-XR) 150 MG 24 hr capsule    hydrOXYzine (ATARAX) 25 MG tablet    oxcarbazepine (TRILEPTAL) 600 MG tablet    venlafaxine XR (EFFEXOR XR) 75 MG 24 hr capsule      Past Psychiatric History:  Bipolar disorder Severe polysubstance  abuse Patient has had several psychiatric hospitalizations in the past.  Past Medical History:  Past Medical History:  Diagnosis Date   ANXIETY 06/16/2010   Bipolar disorder ([place])    DEPRESSION 06/16/2010   FIBROMYALGIA 06/16/2010   HYPERLIPIDEMIA 06/16/2010   HYPERTRIGLYCERIDEMIA 06/16/2010   SCOLIOSIS, MILD 02/16/2007   WISDOM TEETH EXTRACTION, HX OF 02/16/2007    Past Surgical History:   Procedure Laterality Date   WISDOM TOOTH EXTRACTION      Family Psychiatric History:  Patient's mother has a history of anxiety  Family History:  Family History  Problem Relation Age of Onset   Hypertension Other    Anxiety disorder Other     Social History:  Social History   Socioeconomic History   Marital status: Divorced    Spouse name: Not on file   Number of children: Not on file   Years of education: Not on file   Highest education level: Not on file  Occupational History   Not on file  Tobacco Use   Smoking status: Former    Packs/day: 1.50    Types: Cigarettes   Smokeless tobacco: Never  Substance and Sexual Activity   Alcohol use: No   Drug use: No   Sexual activity: Not on file  Other Topics Concern   Not on file  Social History Narrative   Not on file   Social Determinants of Health   Financial Resource Strain: Not on file  Food Insecurity: Not on file  Transportation Needs: Not on file  Physical Activity: Not on file  Stress: Not on file  Social Connections: Not on file    Allergies: No Known Allergies  Metabolic Disorder Labs: Lab Results  Component Value Date   HGBA1C 5.7 07/11/2012   No results found for: PROLACTIN Lab Results  Component Value Date   CHOL 203 (H) 07/11/2012   TRIG 131.0 07/11/2012   HDL 39.40 07/11/2012   CHOLHDL 5 07/11/2012   VLDL 26.2 07/11/2012   Lab Results  Component Value Date   TSH 0.549 05/25/2019   TSH 0.61 07/11/2012    Therapeutic Level Labs: No results found for: LITHIUM No results found for: VALPROATE No components found for:  CBMZ  Current Medications: Current Outpatient Medications  Medication Sig Dispense Refill   buPROPion (WELLBUTRIN XL) 150 MG 24 hr tablet Take 1 tablet (150 mg total) by mouth every morning. 30 tablet 2   busPIRone (BUSPAR) 15 MG tablet Take 1 tablet (15 mg total) by mouth 3 (three) times daily. 90 tablet 2   hydrOXYzine (ATARAX) 25 MG tablet Take 1 tablet (25 mg  total) by mouth 3 (three) times daily as needed for anxiety. 60 tablet 2   oxcarbazepine (TRILEPTAL) 600 MG tablet Take 1 tablet (600 mg total) by mouth 2 (two) times daily. 60 tablet 2   traZODone (DESYREL) 100 MG tablet TAKE 2 TABLETS BY MOUTH EVERY NIGHT AT BEDTIME 60 tablet 2   venlafaxine XR (EFFEXOR XR) 75 MG 24 hr capsule Take 1 capsule (75 mg total) by mouth daily. 30 capsule 2   venlafaxine XR (EFFEXOR-XR) 150 MG 24 hr capsule Take 1 capsule (150 mg total) by mouth daily with breakfast. 1 Capsule Daily along with 1 Daily Capsule  75 mg (total 225mg ) 30 capsule 2   No current facility-administered medications for this visit.     Musculoskeletal: Strength & Muscle Tone: Unable to assess due to telemedicine visit [place]: Unable to assess due to telemedicine visit Patient leans:  Unable to assess due to telemedicine visit  Psychiatric Specialty Exam: Review of Systems  Psychiatric/Behavioral:  Negative for decreased concentration, dysphoric mood, hallucinations, self-injury, sleep disturbance and suicidal ideas. The patient is nervous/anxious. The patient is not hyperactive.    There were no vitals taken for this visit.There is no height or weight on file to calculate BMI.  General Appearance: Unable to assess due to telemedicine visit  Eye Contact:  Unable to assess due to telemedicine visit  Speech:  Clear and Coherent and Normal Rate  Volume:  Normal  Mood:  Anxious and Euthymic  Affect:  Appropriate and Congruent  Thought Process:  Coherent and Descriptions of Associations: Intact  Orientation:  Full (Time, Place, and Person)  Thought Content: WDL   Suicidal Thoughts:  No  Homicidal Thoughts:  No  Memory:  Immediate;   Good Recent;   Fair Remote;   Fair  Judgement:  Fair  Insight:  Fair  Psychomotor Activity:  Normal  Concentration:  Concentration: Good and Attention Span: Good  Recall:  Good  Fund of Knowledge: Good  Language: Good  Akathisia:  No  Handed:   Right  AIMS (if indicated): not done  Assets:  Communication Skills Desire for Improvement Financial Resources/Insurance Housing  ADL's:  Intact  Cognition: WNL  Sleep:  Good   Screenings: AIMS    Flowsheet Row Admission (Discharged) from OP Visit from 05/24/2019 in [place] Total Score 0      AUDIT    Flowsheet Row Admission (Discharged) from OP Visit from 05/24/2019 in [place]  Alcohol Use Disorder Identification Test Final Score (AUDIT) 0      GAD-7    Flowsheet Row Video Visit from 06/22/2021 in [facility] Video Visit from 11/05/2020 in [facility]  Total GAD-7 Score 4 7      PHQ2-9    Flowsheet Row Video Visit from 06/22/2021 in [facility] Video Visit from 11/05/2020 in [place]  PHQ-2 Total Score 1 2  PHQ-9 Total Score -- 8      Flowsheet Row Video Visit from 06/22/2021 in [facility] Video Visit from 11/05/2020 in [facility] Admission (Discharged) from OP Visit from 05/24/2019 in [place] Low Risk No Risk No Risk        Assessment and Plan:   Corey Smith is a 36 year old male with a past psychiatric history significant for bipolar 1 disorder who presents to [facility] via virtual telephone visit for follow-up and medication management.  Patient continues to take medications as prescribed and appears/sounds stable.  Patient denies the need for dosage adjustments at this time and is requesting refills on all of his medications following the conclusion of the encounter.  Patient's medications to be e-prescribed to pharmacy of choice.  1. Bipolar 1 disorder (HCC)  - traZODone (DESYREL) 100 MG tablet; TAKE 2 TABLETS BY MOUTH EVERY NIGHT AT  BEDTIME  Dispense: 60 tablet; Refill: 2 - busPIRone (BUSPAR) 15 MG tablet; Take 1 tablet (15 mg total) by mouth 3 (three) times daily.  Dispense: 90 tablet; Refill: 2 - buPROPion (WELLBUTRIN XL) 150 MG 24 hr tablet; Take 1 tablet (150 mg total) by mouth every morning.  Dispense: 30 tablet; Refill: 2 - venlafaxine XR (EFFEXOR-XR) 150 MG 24 hr capsule; Take 1 capsule (150 mg total)  by mouth daily with breakfast. 1 Capsule Daily along with 1 Daily Capsule  75 mg (total 225mg )  Dispense: 30 capsule; Refill: 2 - hydrOXYzine (ATARAX) 25 MG tablet; Take 1 tablet (25 mg total) by mouth 3 (three) times daily as needed for anxiety.  Dispense: 60 tablet; Refill: 2 - oxcarbazepine (TRILEPTAL) 600 MG tablet; Take 1 tablet (600 mg total) by mouth 2 (two) times daily.  Dispense: 60 tablet; Refill: 2 - venlafaxine XR (EFFEXOR XR) 75 MG 24 hr capsule; Take 1 capsule (75 mg total) by mouth daily.  Dispense: 30 capsule; Refill: 2  Patient to follow up in 3 months Provider spent a total of 10 minutes with the patient/reviewing patient's chart  Malachy Mood, PA 06/22/2021, 9:04 AM

## 2021-09-21 ENCOUNTER — Telehealth (INDEPENDENT_AMBULATORY_CARE_PROVIDER_SITE_OTHER): Payer: No Payment, Other | Admitting: Physician Assistant

## 2021-09-21 ENCOUNTER — Encounter (HOSPITAL_COMMUNITY): Payer: Self-pay | Admitting: Physician Assistant

## 2021-09-21 DIAGNOSIS — F319 Bipolar disorder, unspecified: Secondary | ICD-10-CM | POA: Diagnosis not present

## 2021-09-21 MED ORDER — BUSPIRONE HCL 15 MG PO TABS: 15.0000 mg | ORAL_TABLET | Freq: Three times a day (TID) | ORAL | 3 refills | Status: DC

## 2021-09-21 MED ORDER — VENLAFAXINE HCL ER 75 MG PO CP24: 75.0000 mg | ORAL_CAPSULE | Freq: Every day | ORAL | 3 refills | Status: DC

## 2021-09-21 MED ORDER — BUPROPION HCL ER (XL) 150 MG PO TB24: 150.0000 mg | ORAL_TABLET | ORAL | 3 refills | Status: DC

## 2021-09-21 MED ORDER — OXCARBAZEPINE 600 MG PO TABS: 600.0000 mg | ORAL_TABLET | Freq: Two times a day (BID) | ORAL | 3 refills | Status: DC

## 2021-09-21 MED ORDER — TRAZODONE HCL 100 MG PO TABS: ORAL_TABLET | ORAL | 3 refills | Status: DC

## 2021-09-21 MED ORDER — VENLAFAXINE HCL ER 150 MG PO CP24: 150.0000 mg | ORAL_CAPSULE | Freq: Every day | ORAL | 3 refills | Status: DC

## 2021-09-21 NOTE — Progress Notes (Signed)
BH MD/PA/NP OP Progress Note ? ?Virtual Visit via Telephone Note ? ?I connected with Corey Smith on 09/21/21 at  8:00 AM EDT by telephone and verified that I am speaking with the correct person using two identifiers. ? ?Location: ?Patient: Home ?Provider: Clinic ?  ?I discussed the limitations, risks, security and privacy concerns of performing an evaluation and management service by telephone and the availability of in person appointments. I also discussed with the patient that there may be a patient responsible charge related to this service. The patient expressed understanding and agreed to proceed. ? ?Follow Up Instructions: ?  ?I discussed the assessment and treatment plan with the patient. The patient was provided an opportunity to ask questions and all were answered. The patient agreed with the plan and demonstrated an understanding of the instructions. ?  ?The patient was advised to call back or seek an in-person evaluation if the symptoms worsen or if the condition fails to improve as anticipated. ? ?I provided 7 minutes of non-face-to-face time during this encounter. ? ?Meta Hatchet, PA  ? ? ?09/21/2021 8:38 AM ?Corey Smith  ?MRN:  [id number] ? ?Chief Complaint:  ?Chief Complaint  ?Patient presents with  ? Medication Refill  ? Follow-up  ? ?HPI:  ? ?Corey Smith is a 36 year old male with a past psychiatric history significant for bipolar 1 disorder who presents to [facility] via virtual telephone visit for follow-up and medication management.  Patient is currently being managed on the following medications: ? ?Venlafaxine XR (Effexor XR) 225 mg daily ?Oxcarbazepine 600 mg 2 times daily ?Hydroxyzine 25 mg 3 times daily as needed ?Buspirone 15 mg 3 times daily ?Trazodone 100 mg at bedtime ?Bupropion (Wellbutrin XL) 150 mg 24-hour tablet daily ? ?Patient reports that his prescription for hydroxyzine was not filled and he has been without this medications for  roughly a month.  Patient reports that he is managing well without the medication and denies the need for refills of hydroxyzine following the conclusion of the encounter.  For his other medications, patient reports no issues or concerns regarding his current medication regimen.  Patient denies any adverse side effects from his current medication regimen.  Patient denies depressive symptoms.  He states that his anxiety is still present but more manageable through the use of his medications.  Patient rates his anxiety at 5 out of 10.  Patient denies any new stressors at this time.  A GAD-7 screen was performed with the patient scoring a 6. ? ?Patient is alert and oriented x4, calm, cooperative, and fully engaged in conversation during the encounter.  Patient endorses good mood.  Patient denies suicidal or homicidal ideations.  He further denies auditory or visual hallucinations and does not appear to be responding to internal/external stimuli.  Patient endorses good sleep and receives on average 8 hours of sleep each night.  Patient endorses good appetite and eats on average 3 meals per day.  Patient endorses alcohol consumption sparingly.  Patient endorses tobacco use and smokes on average a pack per day.  Patient denies illicit drug use. ? ?Visit Diagnosis:  ?  ICD-10-CM   ?1. Bipolar 1 disorder (HCC)  F31.9 venlafaxine XR (EFFEXOR-XR) 150 MG 24 hr capsule  ?  buPROPion (WELLBUTRIN XL) 150 MG 24 hr tablet  ?  busPIRone (BUSPAR) 15 MG tablet  ?  traZODone (DESYREL) 100 MG tablet  ?  oxcarbazepine (TRILEPTAL) 600 MG tablet  ?  venlafaxine XR [facility]  XR) 75 MG 24 hr capsule  ?  ? ? ?Past Psychiatric History:  ?Bipolar disorder ?Severe polysubstance abuse ?Patient has had several psychiatric hospitalizations in the past. ? ?Past Medical History:  ?Past Medical History:  ?Diagnosis Date  ? ANXIETY 06/16/2010  ? Bipolar disorder (HCC)   ? DEPRESSION 06/16/2010  ? FIBROMYALGIA 06/16/2010  ? HYPERLIPIDEMIA 06/16/2010  ?  HYPERTRIGLYCERIDEMIA 06/16/2010  ? SCOLIOSIS, MILD 02/16/2007  ? WISDOM TEETH EXTRACTION, HX OF 02/16/2007  ?  ?Past Surgical History:  ?Procedure Laterality Date  ? WISDOM TOOTH EXTRACTION    ? ? ?Family Psychiatric History:  ?Patient's mother has a history of anxiety ? ?Family History:  ?Family History  ?Problem Relation Age of Onset  ? Hypertension Other   ? Anxiety disorder Other   ? ? ?Social History:  ?Social History  ? ?Socioeconomic History  ? Marital status: Divorced  ?  Spouse name: Not on file  ? Number of children: Not on file  ? Years of education: Not on file  ? Highest education level: Not on file  ?Occupational History  ? Not on file  ?Tobacco Use  ? Smoking status: Former  ?  Packs/day: 1.50  ?  Types: Cigarettes  ? Smokeless tobacco: Never  ?Substance and Sexual Activity  ? Alcohol use: No  ? Drug use: No  ? Sexual activity: Not on file  ?Other Topics Concern  ? Not on file  ?Social History Narrative  ? Not on file  ? ?Social Determinants of Health  ? ?Financial Resource Strain: Not on file  ?Food Insecurity: Not on file  ?Transportation Needs: Not on file  ?Physical Activity: Not on file  ?Stress: Not on file  ?Social Connections: Not on file  ? ? ?Allergies: No Known Allergies ? ?Metabolic Disorder Labs: ?Lab Results  ?Component Value Date  ? HGBA1C 5.7 07/11/2012  ? ?No results found for: PROLACTIN ?Lab Results  ?Component Value Date  ? CHOL 203 (H) 07/11/2012  ? TRIG 131.0 07/11/2012  ? HDL 39.40 07/11/2012  ? CHOLHDL 5 07/11/2012  ? VLDL 26.2 07/11/2012  ? ?Lab Results  ?Component Value Date  ? TSH 0.549 05/25/2019  ? TSH 0.61 07/11/2012  ? ? ?Therapeutic Level Labs: ?No results found for: LITHIUM ?No results found for: VALPROATE ?No components found for:  CBMZ ? ?Current Medications: ?Current Outpatient Medications  ?Medication Sig Dispense Refill  ? buPROPion (WELLBUTRIN XL) 150 MG 24 hr tablet Take 1 tablet (150 mg total) by mouth every morning. 30 tablet 3  ? busPIRone (BUSPAR) 15 MG tablet  Take 1 tablet (15 mg total) by mouth 3 (three) times daily. 90 tablet 3  ? hydrOXYzine (ATARAX) 25 MG tablet Take 1 tablet (25 mg total) by mouth 3 (three) times daily as needed for anxiety. 60 tablet 2  ? oxcarbazepine (TRILEPTAL) 600 MG tablet Take 1 tablet (600 mg total) by mouth 2 (two) times daily. 60 tablet 3  ? traZODone (DESYREL) 100 MG tablet TAKE 2 TABLETS BY MOUTH EVERY NIGHT AT BEDTIME 60 tablet 3  ? venlafaxine XR (EFFEXOR XR) 75 MG 24 hr capsule Take 1 capsule (75 mg total) by mouth daily. 30 capsule 3  ? venlafaxine XR (EFFEXOR-XR) 150 MG 24 hr capsule Take 1 capsule (150 mg total) by mouth daily with breakfast. 1 Capsule Daily along with 1 Daily Capsule  75 mg (total 225mg ) 30 capsule 3  ? ?No current facility-administered medications for this visit.  ? ? ? ?Musculoskeletal: ?Strength & Muscle Tone:  Unable to assess due to telemedicine visit ?Gait & Station: Unable to assess due to telemedicine visit ?Patient leans: Unable to assess due to telemedicine visit ? ?Psychiatric Specialty Exam: ?Review of Systems  ?Psychiatric/Behavioral:  Negative for decreased concentration, dysphoric mood, hallucinations, self-injury, sleep disturbance and suicidal ideas. The patient is nervous/anxious. The patient is not hyperactive.    ?There were no vitals taken for this visit.There is no height or weight on file to calculate BMI.  ?General Appearance: Unable to assess due to telemedicine visit  ?Eye Contact:  Unable to assess due to telemedicine visit  ?Speech:  Clear and Coherent and Normal Rate  ?Volume:  Normal  ?Mood:  Anxious and Euthymic  ?Affect:  Appropriate and Congruent  ?Thought Process:  Coherent and Descriptions of Associations: Intact  ?Orientation:  Full (Time, Place, and Person)  ?Thought Content: WDL   ?Suicidal Thoughts:  No  ?Homicidal Thoughts:  No  ?Memory:  Immediate;   Good ?Recent;   Fair ?Remote;   Fair  ?Judgement:  Fair  ?Insight:  Fair  ?Psychomotor Activity:  Normal  ?Concentration:   Concentration: Good and Attention Span: Good  ?Recall:  Good  ?Fund of Knowledge: Good  ?Language: Good  ?Akathisia:  No  ?Handed:  Right  ?AIMS (if indicated): not done  ?Assets:  Communication Skills ?Desire f

## 2021-12-14 ENCOUNTER — Other Ambulatory Visit: Payer: Self-pay

## 2021-12-14 ENCOUNTER — Encounter: Payer: Self-pay | Admitting: Critical Care Medicine

## 2021-12-14 ENCOUNTER — Ambulatory Visit: Payer: Medicaid Other | Attending: Critical Care Medicine | Admitting: Critical Care Medicine

## 2021-12-14 VITALS — BP 139/85 | HR 118 | Temp 98.4°F | Resp 18 | Ht 67.0 in | Wt 250.0 lb

## 2021-12-14 DIAGNOSIS — R3589 Other polyuria: Secondary | ICD-10-CM

## 2021-12-14 DIAGNOSIS — R7302 Impaired glucose tolerance (oral): Secondary | ICD-10-CM

## 2021-12-14 DIAGNOSIS — M545 Low back pain, unspecified: Secondary | ICD-10-CM

## 2021-12-14 DIAGNOSIS — M79605 Pain in left leg: Secondary | ICD-10-CM | POA: Insufficient documentation

## 2021-12-14 DIAGNOSIS — F419 Anxiety disorder, unspecified: Secondary | ICD-10-CM | POA: Insufficient documentation

## 2021-12-14 DIAGNOSIS — K429 Umbilical hernia without obstruction or gangrene: Secondary | ICD-10-CM

## 2021-12-14 DIAGNOSIS — F314 Bipolar disorder, current episode depressed, severe, without psychotic features: Secondary | ICD-10-CM

## 2021-12-14 DIAGNOSIS — Z720 Tobacco use: Secondary | ICD-10-CM

## 2021-12-14 DIAGNOSIS — F17210 Nicotine dependence, cigarettes, uncomplicated: Secondary | ICD-10-CM | POA: Insufficient documentation

## 2021-12-14 DIAGNOSIS — E119 Type 2 diabetes mellitus without complications: Secondary | ICD-10-CM | POA: Insufficient documentation

## 2021-12-14 DIAGNOSIS — Z79899 Other long term (current) drug therapy: Secondary | ICD-10-CM | POA: Insufficient documentation

## 2021-12-14 DIAGNOSIS — M549 Dorsalgia, unspecified: Secondary | ICD-10-CM | POA: Insufficient documentation

## 2021-12-14 DIAGNOSIS — I10 Essential (primary) hypertension: Secondary | ICD-10-CM

## 2021-12-14 DIAGNOSIS — Z6839 Body mass index (BMI) 39.0-39.9, adult: Secondary | ICD-10-CM

## 2021-12-14 DIAGNOSIS — M79604 Pain in right leg: Secondary | ICD-10-CM | POA: Insufficient documentation

## 2021-12-14 DIAGNOSIS — F319 Bipolar disorder, unspecified: Secondary | ICD-10-CM

## 2021-12-14 DIAGNOSIS — Z139 Encounter for screening, unspecified: Secondary | ICD-10-CM

## 2021-12-14 DIAGNOSIS — F331 Major depressive disorder, recurrent, moderate: Secondary | ICD-10-CM

## 2021-12-14 MED ORDER — GABAPENTIN 300 MG PO CAPS
300.0000 mg | ORAL_CAPSULE | Freq: Three times a day (TID) | ORAL | 3 refills | Status: DC
  Filled 2021-12-14: qty 90, 30d supply, fill #0
  Filled 2022-01-10: qty 90, 30d supply, fill #1
  Filled 2022-02-08: qty 90, 30d supply, fill #2
  Filled 2022-03-08: qty 90, 30d supply, fill #3

## 2021-12-14 MED ORDER — CYCLOBENZAPRINE HCL 10 MG PO TABS
10.0000 mg | ORAL_TABLET | Freq: Three times a day (TID) | ORAL | 0 refills | Status: DC | PRN
  Filled 2021-12-14: qty 30, 10d supply, fill #0

## 2021-12-14 MED ORDER — NICOTINE 21 MG/24HR TD PT24
21.0000 mg | MEDICATED_PATCH | Freq: Every day | TRANSDERMAL | 0 refills | Status: DC
  Filled 2021-12-14: qty 28, 28d supply, fill #0

## 2021-12-14 MED ORDER — VALSARTAN 80 MG PO TABS
80.0000 mg | ORAL_TABLET | Freq: Every day | ORAL | 3 refills | Status: DC
  Filled 2021-12-14: qty 30, 30d supply, fill #0
  Filled 2022-01-10: qty 30, 30d supply, fill #1
  Filled 2022-02-08: qty 30, 30d supply, fill #2

## 2021-12-14 NOTE — Assessment & Plan Note (Signed)
Per mental health team

## 2021-12-14 NOTE — Progress Notes (Signed)
New Patient Office Visit  Subjective    Patient ID: Corey Smith, male    DOB: 1985/08/17  Age: 36 y.o. MRN: [id number]  CC:  Chief Complaint  Patient presents with   New Patient (Initial Visit)    5 years since last pcp, [facility] [address].  B\p concern, elevated resting pulse concern, Diabetes, umbilical hernia, gerenalized back pain, sciatica    HPI KERRIE LATOUR presents to establish care. He was last seen by a primary care provider 5 years ago. He has history of anxiety, major depressive disorder, bipolar 1 disorder, history of opioid abuse and low back pain with sciatica. 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   Patient sees [facility] regularly and they manage his mental health medications. Currently he is on: Buspirone 15 mg TID, Trileptal 600 mg BID, Trazodone 100 mg daily and Effexor 150 mg daily. Reports he is satisfied with his medication regimen and feels well controlled.  Next appointment is 12/21/2021.   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 Methadone was stopped about 4 years ago. Last heroine use about 3 years ago.   He has a long standing history of lower back pain with sciatica pains. Pain affects both legs equally. Reports a prior MRI years ago. Also states he was on gabapentin and flexeril at one time and they substantially improved his symptoms.   Reports he has gained a considerable amount of weight. Today he is at 250 lbs, no previous weight on file. However, reports he was 180 lbs about 3 years ago. He is interested in weight loss tips.   He is concerned his blood pressure is high. Today's blood pressure is 139/85. Reports when he checks it himself, the numbers are usually right around that too.  He does smoke cigarettes, about 1 pack a day.  Lastly, he reports he has an umbilical hernia that has been present for years. His father also had one. It does not cause him pain,  only mild discomfort while lifting/certain positions, and reduces when he pushes on it.   He does not have insurance at this time, however he did obtain orange card application today.     Outpatient Encounter Medications as of 12/14/2021  Medication Sig   busPIRone (BUSPAR) 15 MG tablet Take 1 tablet (15 mg total) by mouth 3 (three) times daily.   cyclobenzaprine (FLEXERIL) 10 MG tablet Take 1 tablet (10 mg total) by mouth 3 (three) times daily as needed for muscle spasms.   gabapentin (NEURONTIN) 300 MG capsule Take 1 capsule (300 mg total) by mouth 3 (three) times daily.   nicotine (NICODERM CQ - DOSED IN MG/24 HOURS) 21 mg/24hr patch Place 1 patch (21 mg total) onto the skin daily.   oxcarbazepine (TRILEPTAL) 600 MG tablet Take 1 tablet (600 mg total) by mouth 2 (two) times daily.   traZODone (DESYREL) 100 MG tablet TAKE 2 TABLETS BY MOUTH EVERY NIGHT AT BEDTIME   valsartan (DIOVAN) 80 MG tablet Take 1 tablet (80 mg total) by mouth daily.   venlafaxine XR (EFFEXOR-XR) 150 MG 24 hr capsule Take 1 capsule (150 mg total) by mouth daily with breakfast. 1 Capsule Daily along with 1 Daily Capsule  75 mg (total 225mg )   [DISCONTINUED] buPROPion (WELLBUTRIN XL) 150 MG 24 hr tablet Take 1 tablet (150 mg total) by mouth every morning.   [DISCONTINUED] hydrOXYzine (ATARAX) 25 MG tablet Take 1 tablet (  25 mg total) by mouth 3 (three) times daily as needed for anxiety. (Patient not taking: Reported on 12/14/2021)   [DISCONTINUED] venlafaxine XR (EFFEXOR XR) 75 MG 24 hr capsule Take 1 capsule (75 mg total) by mouth daily. (Patient not taking: Reported on 12/14/2021)   No facility-administered encounter medications on file as of 12/14/2021.    Past Medical History:  Diagnosis Date   ANXIETY 06/16/2010   Bipolar 1 disorder ([place]) 2018   Bipolar disorder ([place])    DEPRESSION 06/16/2010   FIBROMYALGIA 06/16/2010   HYPERLIPIDEMIA 06/16/2010   HYPERTRIGLYCERIDEMIA 06/16/2010   SCOLIOSIS, MILD 02/16/2007    WISDOM TEETH EXTRACTION, HX OF 02/16/2007    Past Surgical History:  Procedure Laterality Date   CORNEAL TRANSPLANT Left 2019   WISDOM TOOTH EXTRACTION Bilateral 2003    Family History  Problem Relation Age of Onset   Breast cancer Mother    Hypertension Father    High Cholesterol Father    Depression Brother    Anxiety disorder Brother    Autism spectrum disorder Son    Hypertension Other    Anxiety disorder Other     Social History   Socioeconomic History   Marital status: Divorced    Spouse name: Not on file   Number of children: Not on file   Years of education: Not on file   Highest education level: Not on file  Occupational History   Not on file  Tobacco Use   Smoking status: Every Day    Packs/day: 1.50    Types: Cigarettes    Start date: 2003   Smokeless tobacco: Never  Vaping Use   Vaping Use: Never used  Substance and Sexual Activity   Alcohol use: Yes    Comment: drinks 4 times per year, 5 cans of beer and/or 2 shots of liquor   Drug use: Not Currently   Sexual activity: Yes    Partners: Female    Comment: Girlfriend is on oral contraception  Other Topics Concern   Not on file  Social History Narrative   Not on file   Social Determinants of Health   Financial Resource Strain: Not on file  Food Insecurity: Not on file  Transportation Needs: Not on file  Physical Activity: Not on file  Stress: Not on file  Social Connections: Not on file  Intimate Partner Violence: Not on file    Review of Systems  Constitutional: Negative.        Weight gain over 3 yrs 180>>>250#  HENT: Negative.    Eyes: Negative.   Respiratory:  Positive for shortness of breath and wheezing. Negative for cough, hemoptysis and sputum production.        With exertion   Cardiovascular: Negative.  Negative for chest pain, orthopnea, claudication and leg swelling.  Gastrointestinal: Negative.   Genitourinary:  Positive for frequency.       Polyuria  Musculoskeletal:   Positive for back pain and neck pain.       Right > left sciatica  Skin: Negative.   Neurological:  Positive for tingling, weakness and headaches. Negative for dizziness, seizures and loss of consciousness.       Hands and feet  Endo/Heme/Allergies:  Positive for polydipsia.  Psychiatric/Behavioral: Negative.  Negative for hallucinations, memory loss, substance abuse and suicidal ideas. The patient is not nervous/anxious and does not have insomnia.         Objective    BP 139/85 (BP Location: Right Arm, Patient Position: Sitting, Cuff Size: Large)  Pulse (!) 118   Temp 98.4 F (36.9 C)   Resp 18   Ht 5\' 7"  (1.702 m)   Wt 250 lb (113.4 kg)   SpO2 95%   BMI 39.16 kg/m   Physical Exam Constitutional:      Appearance: He is obese.  HENT:     Right Ear: Tympanic membrane, ear canal and external ear normal.     Left Ear: Tympanic membrane, ear canal and external ear normal.     Nose: Nose normal.     Mouth/Throat:     Mouth: Mucous membranes are moist.     Pharynx: Oropharynx is clear.  Eyes:     Conjunctiva/sclera: Conjunctivae normal.  Cardiovascular:     Rate and Rhythm: Normal rate and regular rhythm.     Pulses: Normal pulses.     Heart sounds: Normal heart sounds.  Pulmonary:     Effort: Pulmonary effort is normal.     Breath sounds: Normal breath sounds.  Abdominal:     General: Bowel sounds are normal.     Palpations: Abdomen is soft.     Comments: Small umbilical hernia present, easily reducible   Musculoskeletal:     Cervical back: Normal range of motion.  Skin:    General: Skin is warm.     Capillary Refill: Capillary refill takes less than 2 seconds.  Neurological:     General: No focal deficit present.     Mental Status: He is alert and oriented to person, place, and time.     Sensory: No sensory deficit.     Motor: No weakness.     Gait: Gait normal.  Psychiatric:        Mood and Affect: Mood normal.        Behavior: Behavior normal.           Assessment & Plan:   Problem List Items Addressed This Visit       Cardiovascular and Mediastinum   Primary hypertension    Today's blood pressure 138/85. Will begin patient on Valsartan 80 mg daily, medication sent to pharmacy Diet and lifestyle handout reviewed and emphasized to patient today  The following Lifestyle Medicine recommendations according to American College of Lifestyle Medicine [facility]) were discussed and offered to patient who agrees to start the journey:  A. Whole Foods, Plant-based plate comprising of fruits and vegetables, plant-based proteins, whole-grain carbohydrates was discussed in detail with the patient.   A list for source of those nutrients were also provided to the patient.  Patient will use only water or unsweetened tea for hydration. B.  The need to stay away from risky substances including alcohol, smoking; obtaining 7 to 9 hours of restorative sleep, at least 150 minutes of moderate intensity exercise weekly, the importance of healthy social connections,  and stress reduction techniques were discussed. C.  A full color page of  Calorie density of various food groups per pound showing examples of each food groups was provided to the patient.       Relevant Medications   valsartan (DIOVAN) 80 MG tablet   Other Relevant Orders   Comprehensive metabolic panel   Thyroid Panel With TSH     Endocrine   Impaired glucose tolerance    Assess for T2DM with A1C The following Lifestyle Medicine recommendations according to American College of Lifestyle Medicine [facility]) were discussed and offered to patient who agrees to start the journey:  A. Whole Foods, Plant-based plate comprising of fruits and vegetables, plant-based  proteins, whole-grain carbohydrates was discussed in detail with the patient.   A list for source of those nutrients were also provided to the patient.  Patient will use only water or unsweetened tea for hydration. B.  The need to stay away from  risky substances including alcohol, smoking; obtaining 7 to 9 hours of restorative sleep, at least 150 minutes of moderate intensity exercise weekly, the importance of healthy social connections,  and stress reduction techniques were discussed. C.  A full color page of  Calorie density of various food groups per pound showing examples of each food groups was provided to the patient.         Other   MDD (major depressive disorder), recurrent episode, moderate ([place])    On therapy per [facility]      Encounter for health-related screening - Primary    Has not seen a primary provider in over 5 years Screening labs done today       Relevant Orders   Comprehensive metabolic panel   CBC with Differential/Platelet   Lipid panel   Hemoglobin A1c   Thyroid Panel With TSH   Low back pain    Flexeril and Gabapentin sent to pharmacy downstairs  Discussed referral to physical therapy once patient obtains orange card       Relevant Medications   cyclobenzaprine (FLEXERIL) 10 MG tablet   Bipolar I disorder with depression, severe ([place])    Per mental health team      Polyuria    Will screen patient for diabetes/pre diabetes  Urinalysis done today as well       Relevant Orders   Urinalysis   Umbilical hernia    Small, easily reducible Will observe for now Weight loss strongly encouraged, discussed with patient       Tobacco abuse    Nicotine lozange and nicotine patch sent to pharmacy downstairs Diet and lifestyle handout reviewed in depth with patient today     Current smoking consumption amount: 1PPD  Dicsussion on advise to quit smoking and smoking impacts: cv impact lungs  Patient's willingness to quit:  Wants to quit  Methods to quit smoking discussed:  behav mod. Nicotine stay on welbutrin  Medication management of smoking session drugs discussed:  Resources provided:  AVS   Setting quit date 14 days  Follow-up arranged 4 mo   Time spent counseling the patient:  5  min       RESOLVED: Bipolar 1 disorder ([place])    Followed by [facility]  Maintained on: Buspirone, Trileptal, Trazadone and Effexor Recommend he continue follow ups with Behavioral Health, next appointment is 12/21/2021.       45 min spent on H/P pt education multisystems assessed complexity HIGH Return in about 4 months (around 04/15/2022) for htn.   Asencion Noble, MD

## 2021-12-14 NOTE — Assessment & Plan Note (Addendum)
Will screen patient for diabetes/pre diabetes  Urinalysis done today as well

## 2021-12-14 NOTE — Assessment & Plan Note (Signed)
Small, easily reducible Will observe for now Weight loss strongly encouraged, discussed with patient

## 2021-12-14 NOTE — Patient Instructions (Addendum)
Screening labs completed today, we will call you with the results  Vslsartan 80 mg will be sent downstairs to the pharmacy for your blood pressure  Flexeril and Gabapentin prescription will be sent downstairs to the pharmacy for your back pain  Nicotine patch and nicotine lozenge will be sent downstairs to the pharmacy   Continue to follow up with Behavioral Health for medication management  Review diet and lifestyle handout   Please continue to work on the orange card application and give [place] a call when approved   Return to see our clinical pharmacist Franky Macho in 4 weeks for blood pressure management   Return to see Dr Delford Field in 4 months

## 2021-12-14 NOTE — Assessment & Plan Note (Signed)
Assess lipids The following Lifestyle Medicine recommendations according to American College of Lifestyle Medicine [facility]) were discussed and offered to patient who agrees to start the journey:  A. Whole Foods, Plant-based plate comprising of fruits and vegetables, plant-based proteins, whole-grain carbohydrates was discussed in detail with the patient.   A list for source of those nutrients were also provided to the patient.  Patient will use only water or unsweetened tea for hydration. B.  The need to stay away from risky substances including alcohol, smoking; obtaining 7 to 9 hours of restorative sleep, at least 150 minutes of moderate intensity exercise weekly, the importance of healthy social connections,  and stress reduction techniques were discussed. C.  A full color page of  Calorie density of various food groups per pound showing examples of each food groups was provided to the patient.

## 2021-12-14 NOTE — Assessment & Plan Note (Signed)
Has not seen a primary provider in over 5 years Screening labs done today

## 2021-12-14 NOTE — Assessment & Plan Note (Signed)
On therapy per [facility]

## 2021-12-14 NOTE — Assessment & Plan Note (Signed)
Followed by [facility]  Maintained on: Buspirone, Trileptal, Trazadone and Effexor Recommend he continue follow ups with Behavioral Health, next appointment is 12/21/2021.

## 2021-12-14 NOTE — Assessment & Plan Note (Signed)
Flexeril and Gabapentin sent to pharmacy downstairs  Discussed referral to physical therapy once patient obtains orange card

## 2021-12-14 NOTE — Assessment & Plan Note (Addendum)
Nicotine lozange and nicotine patch sent to pharmacy downstairs Diet and lifestyle handout reviewed in depth with patient today     . Current smoking consumption amount: 1PPD  . Dicsussion on advise to quit smoking and smoking impacts: cv impact lungs  . Patient's willingness to quit:  Wants to quit  . Methods to quit smoking discussed:  behav mod. Nicotine stay on welbutrin  . Medication management of smoking session drugs discussed:  . Resources provided:  AVS   . Setting quit date 14 days  . Follow-up arranged 4 mo   Time spent counseling the patient:  5 min

## 2021-12-14 NOTE — Assessment & Plan Note (Addendum)
Today's blood pressure 138/85. Will begin patient on Valsartan 80 mg daily, medication sent to pharmacy Diet and lifestyle handout reviewed and emphasized to patient today  The following Lifestyle Medicine recommendations according to American College of Lifestyle Medicine [facility]) were discussed and offered to patient who agrees to start the journey:  A. Whole Foods, Plant-based plate comprising of fruits and vegetables, plant-based proteins, whole-grain carbohydrates was discussed in detail with the patient.   A list for source of those nutrients were also provided to the patient.  Patient will use only water or unsweetened tea for hydration. B.  The need to stay away from risky substances including alcohol, smoking; obtaining 7 to 9 hours of restorative sleep, at least 150 minutes of moderate intensity exercise weekly, the importance of healthy social connections,  and stress reduction techniques were discussed. C.  A full color page of  Calorie density of various food groups per pound showing examples of each food groups was provided to the patient.

## 2021-12-14 NOTE — Assessment & Plan Note (Signed)
Assess for T2DM with A1C The following Lifestyle Medicine recommendations according to American College of Lifestyle Medicine  [facility]) were discussed and offered to patient who agrees to start the journey:  A. Whole Foods, Plant-based plate comprising of fruits and vegetables, plant-based proteins, whole-grain carbohydrates was discussed in detail with the patient.   A list for source of those nutrients were also provided to the patient.  Patient will use only water or unsweetened tea for hydration. B.  The need to stay away from risky substances including alcohol, smoking; obtaining 7 to 9 hours of restorative sleep, at least 150 minutes of moderate intensity exercise weekly, the importance of healthy social connections,  and stress reduction techniques were discussed. C.  A full color page of  Calorie density of various food groups per pound showing examples of each food groups was provided to the patient.

## 2021-12-15 ENCOUNTER — Other Ambulatory Visit: Payer: Self-pay

## 2021-12-15 ENCOUNTER — Other Ambulatory Visit: Payer: Self-pay | Admitting: Critical Care Medicine

## 2021-12-15 DIAGNOSIS — E782 Mixed hyperlipidemia: Secondary | ICD-10-CM

## 2021-12-15 LAB — CBC WITH DIFFERENTIAL/PLATELET
Basophils Absolute: 0.1 10*3/uL (ref 0.0–0.2)
Basos: 1 %
EOS (ABSOLUTE): 0.1 10*3/uL (ref 0.0–0.4)
Eos: 1 %
Hematocrit: 42.9 % (ref 37.5–51.0)
Hemoglobin: 15 g/dL (ref 13.0–17.7)
Immature Grans (Abs): 0 10*3/uL (ref 0.0–0.1)
Immature Granulocytes: 0 %
Lymphocytes Absolute: 3.5 10*3/uL — ABNORMAL HIGH (ref 0.7–3.1)
Lymphs: 28 %
MCH: 32.1 pg (ref 26.6–33.0)
MCHC: 35 g/dL (ref 31.5–35.7)
MCV: 92 fL (ref 79–97)
Monocytes Absolute: 1 10*3/uL — ABNORMAL HIGH (ref 0.1–0.9)
Monocytes: 8 %
Neutrophils Absolute: 7.9 10*3/uL — ABNORMAL HIGH (ref 1.4–7.0)
Neutrophils: 62 %
Platelets: 312 10*3/uL (ref 150–450)
RBC: 4.67 x10E6/uL (ref 4.14–5.80)
RDW: 12.3 % (ref 11.6–15.4)
WBC: 12.7 10*3/uL — ABNORMAL HIGH (ref 3.4–10.8)

## 2021-12-15 LAB — THYROID PANEL WITH TSH
Free Thyroxine Index: 1.1 — ABNORMAL LOW (ref 1.2–4.9)
T3 Uptake Ratio: 21 % — ABNORMAL LOW (ref 24–39)
T4, Total: 5.4 ug/dL (ref 4.5–12.0)
TSH: 0.389 u[IU]/mL — ABNORMAL LOW (ref 0.450–4.500)

## 2021-12-15 LAB — COMPREHENSIVE METABOLIC PANEL
ALT: 30 IU/L (ref 0–44)
AST: 21 IU/L (ref 0–40)
Albumin/Globulin Ratio: 1.7 (ref 1.2–2.2)
Albumin: 4.5 g/dL (ref 4.1–5.1)
Alkaline Phosphatase: 101 IU/L (ref 44–121)
BUN/Creatinine Ratio: 20 (ref 9–20)
BUN: 17 mg/dL (ref 6–20)
Bilirubin Total: 0.3 mg/dL (ref 0.0–1.2)
CO2: 26 mmol/L (ref 20–29)
Calcium: 10 mg/dL (ref 8.7–10.2)
Chloride: 100 mmol/L (ref 96–106)
Creatinine, Ser: 0.86 mg/dL (ref 0.76–1.27)
Globulin, Total: 2.6 g/dL (ref 1.5–4.5)
Glucose: 87 mg/dL (ref 70–99)
Potassium: 4.6 mmol/L (ref 3.5–5.2)
Sodium: 141 mmol/L (ref 134–144)
Total Protein: 7.1 g/dL (ref 6.0–8.5)
eGFR: 115 mL/min/{1.73_m2} (ref >59)

## 2021-12-15 LAB — URINALYSIS
Bilirubin, UA: NEGATIVE
Glucose, UA: NEGATIVE
Leukocytes,UA: NEGATIVE
Nitrite, UA: NEGATIVE
RBC, UA: NEGATIVE
Specific Gravity, UA: >=1.03 — AB (ref 1.005–1.030)
Urobilinogen, Ur: 0.2 mg/dL (ref 0.2–1.0)
pH, UA: 6 (ref 5.0–7.5)

## 2021-12-15 LAB — HEMOGLOBIN A1C
Est. average glucose Bld gHb Est-mCnc: 111 mg/dL
Hgb A1c MFr Bld: 5.5 % (ref 4.8–5.6)

## 2021-12-15 LAB — LIPID PANEL
Chol/HDL Ratio: 6.5 ratio — ABNORMAL HIGH (ref 0.0–5.0)
Cholesterol, Total: 266 mg/dL — ABNORMAL HIGH (ref 100–199)
HDL: 41 mg/dL (ref >39)
LDL Chol Calc (NIH): 160 mg/dL — ABNORMAL HIGH (ref 0–99)
Triglycerides: 342 mg/dL — ABNORMAL HIGH (ref 0–149)
VLDL Cholesterol Cal: 65 mg/dL — ABNORMAL HIGH (ref 5–40)

## 2021-12-15 MED ORDER — ATORVASTATIN CALCIUM 10 MG PO TABS
10.0000 mg | ORAL_TABLET | Freq: Every day | ORAL | 3 refills | Status: DC
  Filled 2021-12-15: qty 30, 30d supply, fill #0

## 2021-12-15 NOTE — Progress Notes (Signed)
Let pt know blood count normal, cholesterol very high sending cholesterol pill to pharmacy, thyroid is normal, blood sugar normal, kidney liver normal, no diabetes  normal A1C

## 2021-12-16 ENCOUNTER — Telehealth: Payer: Self-pay

## 2021-12-16 NOTE — Telephone Encounter (Signed)
-----   Message from Storm Frisk, MD sent at 12/15/2021  8:33 AM EDT ----- Let pt know blood count normal, cholesterol very high sending cholesterol pill to pharmacy, thyroid is normal, blood sugar normal, kidney liver normal, no diabetes  normal A1C

## 2021-12-16 NOTE — Telephone Encounter (Signed)
Pt was called and is aware of results, DOB was confirmed.  ?

## 2021-12-17 ENCOUNTER — Other Ambulatory Visit: Payer: Self-pay

## 2021-12-21 ENCOUNTER — Other Ambulatory Visit: Payer: Self-pay

## 2021-12-21 ENCOUNTER — Encounter (HOSPITAL_COMMUNITY): Payer: Self-pay | Admitting: Physician Assistant

## 2021-12-21 ENCOUNTER — Telehealth (INDEPENDENT_AMBULATORY_CARE_PROVIDER_SITE_OTHER): Payer: No Payment, Other | Admitting: Physician Assistant

## 2021-12-21 DIAGNOSIS — F319 Bipolar disorder, unspecified: Secondary | ICD-10-CM | POA: Diagnosis not present

## 2021-12-21 MED ORDER — OXCARBAZEPINE 600 MG PO TABS: 600.0000 mg | ORAL_TABLET | Freq: Two times a day (BID) | ORAL | 3 refills | Status: DC

## 2021-12-21 MED ORDER — BUPROPION HCL ER (XL) 150 MG PO TB24: 150.0000 mg | ORAL_TABLET | ORAL | 3 refills | Status: DC

## 2021-12-21 MED ORDER — BUSPIRONE HCL 15 MG PO TABS: 15.0000 mg | ORAL_TABLET | Freq: Three times a day (TID) | ORAL | 3 refills | Status: DC

## 2021-12-21 MED ORDER — HYDROXYZINE HCL 25 MG PO TABS: 25.0000 mg | ORAL_TABLET | Freq: Three times a day (TID) | ORAL | 2 refills | Status: DC | PRN

## 2021-12-21 MED ORDER — TRAZODONE HCL 100 MG PO TABS: ORAL_TABLET | ORAL | 3 refills | Status: DC

## 2021-12-21 MED ORDER — VENLAFAXINE HCL ER 75 MG PO CP24: 75.0000 mg | ORAL_CAPSULE | Freq: Every day | ORAL | 3 refills | Status: DC

## 2021-12-21 MED ORDER — VENLAFAXINE HCL ER 150 MG PO CP24: 150.0000 mg | ORAL_CAPSULE | Freq: Every day | ORAL | 3 refills | Status: DC

## 2021-12-21 NOTE — Progress Notes (Signed)
BH MD/PA/NP OP Progress Note  Virtual Visit via Telephone Note  I connected with Corey Smith on 12/21/21 at  8:00 AM EDT by telephone and verified that I am speaking with the correct person using two identifiers.  Location: Patient: Home Provider: Clinic   I discussed the limitations, risks, security and privacy concerns of performing an evaluation and management service by telephone and the availability of in person appointments. I also discussed with the patient that there may be a patient responsible charge related to this service. The patient expressed understanding and agreed to proceed.  Follow Up Instructions:   I discussed the assessment and treatment plan with the patient. The patient was provided an opportunity to ask questions and all were answered. The patient agreed with the plan and demonstrated an understanding of the instructions.   The patient was advised to call back or seek an in-person evaluation if the symptoms worsen or if the condition fails to improve as anticipated.  I provided 8 minutes of non-face-to-face time during this encounter.  Meta Hatchet, PA    12/21/2021 9:37 AM Corey Smith  MRN:  [id number]  Chief Complaint:  Chief Complaint  Patient presents with   Follow-up   Medication Refill   HPI:   Corey Smith is a 36 year old male with a past psychiatric history significant for bipolar 1 disorder who presents to [facility] via virtual telephone visit for follow-up and medication management.  Patient is currently being managed on the following medications:  Venlafaxine XR (Effexor XR) 225 mg daily Oxcarbazepine 600 mg 2 times daily Hydroxyzine 25 mg 3 times daily as needed Buspirone 15 mg 3 times daily Trazodone 100 mg at bedtime Bupropion (Wellbutrin XL) 150 mg 24-hour tablet daily  Patient reports no issues or concerns regarding his current medication regimen.  Patient denies experiencing any  adverse side effects to taking his medications.  Patient endorses some mild depression but denies any major symptoms.  Patient reports that he experiences depressive symptoms once or twice a week characterized by feeling down but not to the point of self-harm.  Patient endorses anxiety and rates his anxiety as 6 out of 10 at its worst.  Patient's current stressors include ongoing court custody appearances, relationship issues with his girlfriend, and work related stressors.  A GAD-7 screen was performed with the patient scoring a 5.  Patient is alert and oriented x4, calm, cooperative, and fully engaged in conversation during the encounter.  Patient endorses good mood.  Patient denies suicidal or homicidal ideations.  He further denies auditory or visual hallucinations and does not appear to be responding to internal/external stimuli.  Patient endorses good sleep and receives on average 8 hours of sleep each night.  Patient endorses good appetite and eats on average 3 meals per day.  Patient endorses alcohol consumption sparingly.  Patient endorses tobacco use and smokes on average a pack per day.  Patient denies illicit drug use.  Visit Diagnosis:    ICD-10-CM   1. Bipolar 1 disorder (HCC)  F31.9 oxcarbazepine (TRILEPTAL) 600 MG tablet    venlafaxine XR (EFFEXOR-XR) 150 MG 24 hr capsule    busPIRone (BUSPAR) 15 MG tablet    traZODone (DESYREL) 100 MG tablet    venlafaxine XR (EFFEXOR XR) 75 MG 24 hr capsule    buPROPion (WELLBUTRIN XL) 150 MG 24 hr tablet    hydrOXYzine (ATARAX) 25 MG tablet      Past Psychiatric History:  Bipolar  disorder Severe polysubstance abuse Patient has had several psychiatric hospitalizations in the past.  Past Medical History:  Past Medical History:  Diagnosis Date   ANXIETY 06/16/2010   Bipolar 1 disorder (HCC) 2018   Bipolar disorder (HCC)    DEPRESSION 06/16/2010   FIBROMYALGIA 06/16/2010   HYPERLIPIDEMIA 06/16/2010   HYPERTRIGLYCERIDEMIA 06/16/2010    SCOLIOSIS, MILD 02/16/2007   WISDOM TEETH EXTRACTION, HX OF 02/16/2007    Past Surgical History:  Procedure Laterality Date   CORNEAL TRANSPLANT Left 2019   WISDOM TOOTH EXTRACTION Bilateral 2003    Family Psychiatric History:  Patient's mother has a history of anxiety  Family History:  Family History  Problem Relation Age of Onset   Breast cancer Mother    Hypertension Father    High Cholesterol Father    Depression Brother    Anxiety disorder Brother    Autism spectrum disorder Son    Hypertension Other    Anxiety disorder Other     Social History:  Social History   Socioeconomic History   Marital status: Divorced    Spouse name: Not on file   Number of children: Not on file   Years of education: Not on file   Highest education level: Not on file  Occupational History   Not on file  Tobacco Use   Smoking status: Every Day    Packs/day: 1.50    Types: Cigarettes    Start date: 2003   Smokeless tobacco: Never  Vaping Use   Vaping Use: Never used  Substance and Sexual Activity   Alcohol use: Yes    Comment: drinks 4 times per year, 5 cans of beer and/or 2 shots of liquor   Drug use: Not Currently   Sexual activity: Yes    Partners: Female    Comment: Girlfriend is on oral contraception  Other Topics Concern   Not on file  Social History Narrative   Not on file   Social Determinants of Health   Financial Resource Strain: Not on file  Food Insecurity: Not on file  Transportation Needs: Not on file  Physical Activity: Not on file  Stress: Not on file  Social Connections: Not on file    Allergies: No Known Allergies  Metabolic Disorder Labs: Lab Results  Component Value Date   HGBA1C 5.5 12/14/2021   No results found for: "PROLACTIN" Lab Results  Component Value Date   CHOL 266 (H) 12/14/2021   TRIG 342 (H) 12/14/2021   HDL 41 12/14/2021   CHOLHDL 6.5 (H) 12/14/2021   VLDL 26.2 07/11/2012   LDLCALC 160 (H) 12/14/2021   Lab Results   Component Value Date   TSH 0.389 (L) 12/14/2021   TSH 0.549 05/25/2019    Therapeutic Level Labs: No results found for: "LITHIUM" No results found for: "VALPROATE" No results found for: "CBMZ"  Current Medications: Current Outpatient Medications  Medication Sig Dispense Refill   buPROPion (WELLBUTRIN XL) 150 MG 24 hr tablet Take 1 tablet (150 mg total) by mouth every morning. 30 tablet 3   hydrOXYzine (ATARAX) 25 MG tablet Take 1 tablet (25 mg total) by mouth 3 (three) times daily as needed. 75 tablet 2   venlafaxine XR (EFFEXOR XR) 75 MG 24 hr capsule Take 1 capsule (75 mg total) by mouth daily. 30 capsule 3   atorvastatin (LIPITOR) 10 MG tablet Take 1 tablet (10 mg total) by mouth daily. 90 tablet 3   busPIRone (BUSPAR) 15 MG tablet Take 1 tablet (15 mg total) by  mouth 3 (three) times daily. 90 tablet 3   cyclobenzaprine (FLEXERIL) 10 MG tablet Take 1 tablet (10 mg total) by mouth 3 (three) times daily as needed for muscle spasms. 30 tablet 0   gabapentin (NEURONTIN) 300 MG capsule Take 1 capsule (300 mg total) by mouth 3 (three) times daily. 90 capsule 3   nicotine (NICODERM CQ - DOSED IN MG/24 HOURS) 21 mg/24hr patch Place 1 patch (21 mg total) onto the skin daily. 28 patch 0   oxcarbazepine (TRILEPTAL) 600 MG tablet Take 1 tablet (600 mg total) by mouth 2 (two) times daily. 60 tablet 3   traZODone (DESYREL) 100 MG tablet TAKE 2 TABLETS BY MOUTH EVERY NIGHT AT BEDTIME 60 tablet 3   valsartan (DIOVAN) 80 MG tablet Take 1 tablet (80 mg total) by mouth daily. 90 tablet 3   venlafaxine XR (EFFEXOR-XR) 150 MG 24 hr capsule Take 1 capsule (150 mg total) by mouth daily with breakfast. 1 Capsule Daily along with 1 Daily Capsule  75 mg (total 225mg ) 30 capsule 3   No current facility-administered medications for this visit.     Musculoskeletal: Strength & Muscle Tone: Unable to assess due to telemedicine visit Gait & Station: Unable to assess due to telemedicine visit Patient leans:  Unable to assess due to telemedicine visit  Psychiatric Specialty Exam: Review of Systems  Psychiatric/Behavioral:  Negative for decreased concentration, dysphoric mood, hallucinations, self-injury, sleep disturbance and suicidal ideas. The patient is nervous/anxious. The patient is not hyperactive.     There were no vitals taken for this visit.There is no height or weight on file to calculate BMI.  General Appearance: Unable to assess due to telemedicine visit  Eye Contact:  Unable to assess due to telemedicine visit  Speech:  Clear and Coherent and Normal Rate  Volume:  Normal  Mood:  Anxious and Euthymic  Affect:  Appropriate and Congruent  Thought Process:  Coherent and Descriptions of Associations: Intact  Orientation:  Full (Time, Place, and Person)  Thought Content: WDL   Suicidal Thoughts:  No  Homicidal Thoughts:  No  Memory:  Immediate;   Good Recent;   Fair Remote;   Fair  Judgement:  Fair  Insight:  Fair  Psychomotor Activity:  Normal  Concentration:  Concentration: Good and Attention Span: Good  Recall:  Good  Fund of Knowledge: Good  Language: Good  Akathisia:  No  Handed:  Right  AIMS (if indicated): not done  Assets:  Communication Skills Desire for Improvement Financial Resources/Insurance Housing  ADL's:  Intact  Cognition: WNL  Sleep:  Good   Screenings: AIMS    Flowsheet Row Admission (Discharged) from OP Visit from 05/24/2019 in [facility]  AIMS Total Score 0      AUDIT    Flowsheet Row Admission (Discharged) from OP Visit from 05/24/2019 in [facility]  Alcohol Use Disorder Identification Test Final Score (AUDIT) 0      GAD-7    Flowsheet Row Video Visit from 12/21/2021 in [facility] Office Visit from 12/14/2021 in [facility] Video Visit from 09/21/2021 in [facility] Video Visit from 06/22/2021 in [facility] Video Visit from 11/05/2020 in [facility]  Total GAD-7 Score 5 12 6 4 7       PHQ2-9    Flowsheet Row Video Visit from 12/21/2021 in [facility] Office Visit from 12/14/2021 in  [facility] Video Visit from 09/21/2021 in [facility] Video Visit from 06/22/2021 in [facility] Video Visit from 11/05/2020 in [facility]  PHQ-2 Total Score 1 1 1 1 2   PHQ-9 Total Score -- 7 -- -- 8      Flowsheet Row Video Visit from 12/21/2021 in [facility] Video Visit from 09/21/2021 in [facility] Visit from [facility]/22[facility] in [facility] [address]  C-SSRS RISK CATEGORY Low Risk Low Risk Low Risk        Assessment and Plan:   Corey Smith is a 36 year old male with a past psychiatric history significant for bipolar 1 disorder who presents to [facility] via virtual telephone visit for follow-up and medication management.  Patient endorses some mild depression and anxiety but denies any major symptoms at this time.  Patient appears to be stable on his current medication regimen and denies the need for dosage adjustments at this time.  Patient to continue taking medications as prescribed.  Patient's medications to be be e-prescribed to pharmacy of choice.  Collaboration of Care:Collaboration of Care: Medication Management AEB provider managing patient's psychiatric medications and Psychiatrist AEB patient being followed by mental health provider  Patient/Guardian was advised Release of Information must be obtained prior to any record release in order to collaborate their care with an outside provider. Patient/Guardian was advised if they have not already done so to contact the registration  department to sign all necessary forms in order for [facility] to release information regarding their care.   Consent: Patient/Guardian gives verbal consent for treatment and assignment of benefits for services provided during this visit. Patient/Guardian expressed understanding and agreed to proceed.  1. Bipolar 1 disorder (HCC)  - oxcarbazepine (TRILEPTAL) 600 MG tablet; Take 1 tablet (600 mg total) by mouth 2 (two) times daily.  Dispense: 60 tablet; Refill: 3 - venlafaxine XR (EFFEXOR-XR) 150 MG 24 hr capsule; Take 1 capsule (150 mg total) by mouth daily with breakfast. 1 Capsule Daily along with 1 Daily Capsule  75 mg (total 225mg )  Dispense: 30 capsule; Refill: 3 - busPIRone (BUSPAR) 15 MG tablet; Take 1 tablet (15 mg total) by mouth 3 (three) times daily.  Dispense: 90 tablet; Refill: 3 - traZODone (DESYREL) 100 MG tablet; TAKE 2 TABLETS BY MOUTH EVERY NIGHT AT BEDTIME  Dispense: 60 tablet; Refill: 3 - venlafaxine XR (EFFEXOR XR) 75 MG 24 hr capsule; Take 1 capsule (75 mg total) by mouth daily.  Dispense: 30 capsule; Refill: 3 - buPROPion (WELLBUTRIN XL) 150 MG 24 hr tablet; Take 1 tablet (150 mg total) by mouth every morning.  Dispense: 30 tablet; Refill: 3 - hydrOXYzine (ATARAX) 25 MG tablet; Take 1 tablet (25 mg total) by mouth 3 (three) times daily as needed.  Dispense: 75 tablet; Refill: 2   Patient to follow up in 3 months Provider spent a total of 8 minutes with the patient/reviewing patient's chart  [place], PA 12/21/2021, 9:37 AM

## 2022-01-10 ENCOUNTER — Other Ambulatory Visit: Payer: Self-pay | Admitting: Critical Care Medicine

## 2022-01-11 ENCOUNTER — Other Ambulatory Visit: Payer: Self-pay

## 2022-01-12 ENCOUNTER — Other Ambulatory Visit: Payer: Self-pay

## 2022-01-12 MED ORDER — CYCLOBENZAPRINE HCL 10 MG PO TABS
10.0000 mg | ORAL_TABLET | Freq: Three times a day (TID) | ORAL | 1 refills | Status: DC | PRN
  Filled 2022-01-12: qty 30, 10d supply, fill #0
  Filled 2022-02-08: qty 30, 10d supply, fill #1

## 2022-01-12 NOTE — Telephone Encounter (Signed)
Requested medication (s) are due for refill today -yes  Requested medication (s) are on the active medication list -yes  Future visit scheduled -yes  Last refill: 12/14/21 #30  Notes to clinic: non delegated Rx  Requested Prescriptions  Pending Prescriptions Disp Refills   cyclobenzaprine (FLEXERIL) 10 MG tablet 30 tablet 0    Sig: Take 1 tablet (10 mg total) by mouth 3 (three) times daily as needed for muscle spasms.     Not Delegated - Analgesics:  Muscle Relaxants Failed - 01/10/2022  8:12 PM      Failed - This refill cannot be delegated      Passed - Valid encounter within last 6 months    Recent Outpatient Visits           4 weeks ago Primary hypertension   [facility] Storm Frisk, MD       Future Appointments             In 6 days Drucilla Chalet, RPH-CPP [facility]   In 3 months Storm Frisk, MD [facility]               Requested Prescriptions  Pending Prescriptions Disp Refills   cyclobenzaprine (FLEXERIL) 10 MG tablet 30 tablet 0    Sig: Take 1 tablet (10 mg total) by mouth 3 (three) times daily as needed for muscle spasms.     Not Delegated - Analgesics:  Muscle Relaxants Failed - 01/10/2022  8:12 PM      Failed - This refill cannot be delegated      Passed - Valid encounter within last 6 months    Recent Outpatient Visits           4 weeks ago Primary hypertension   [place] [facility] Storm Frisk, MD       Future Appointments             In 6 days Lois Huxley, Cornelius Moras, RPH-CPP [facility]   In 3 months Delford Field Charlcie Cradle, MD [facility]

## 2022-01-18 ENCOUNTER — Other Ambulatory Visit: Payer: Self-pay

## 2022-01-18 ENCOUNTER — Encounter: Payer: Self-pay | Admitting: Pharmacist

## 2022-01-18 ENCOUNTER — Ambulatory Visit: Payer: Medicaid Other | Attending: Critical Care Medicine | Admitting: Pharmacist

## 2022-01-18 ENCOUNTER — Encounter: Payer: Self-pay | Admitting: Critical Care Medicine

## 2022-01-18 VITALS — BP 135/90 | HR 130

## 2022-01-18 DIAGNOSIS — R000 Tachycardia, unspecified: Secondary | ICD-10-CM

## 2022-01-18 DIAGNOSIS — I10 Essential (primary) hypertension: Secondary | ICD-10-CM

## 2022-01-18 MED ORDER — CARVEDILOL 6.25 MG PO TABS
6.2500 mg | ORAL_TABLET | Freq: Two times a day (BID) | ORAL | 2 refills | Status: DC
  Filled 2022-01-18: qty 60, 30d supply, fill #0
  Filled 2022-03-08: qty 60, 30d supply, fill #1

## 2022-01-18 NOTE — Progress Notes (Signed)
   S:     No chief complaint on file.  Corey Smith is a 36 y.o. male who presents for hypertension evaluation, education, and management.  PMH is significant for HTN, HLD, tachycardia, MDD, Bipolar I, and tobacco use.  Patient was referred and last seen by Primary Care Provider, Dr. Joya Gaskins, on 12/14/2021. Valsartan was started at that visit.   Today, patient arrives in good spirits and presents without assistance. Denies dizziness, headache, blurred vision, swelling.    Family/Social history:  Fhx: HTN, HLD, depression, GAD Tobacco: current 1 PPD smoker (does not smoke 1.5 PPD). Did smoke a cigarette within 30 minutes of this appt today.  Alcohol: rarely   Medication adherence reported. Patient has not taken BP medications today. He takes his valsartan in the morning.   Current antihypertensives include: valsartan 80 mg daily (takes at night)  Reported home BP readings:  -No cuff at home.   Patient reported dietary habits:  Sodium: tries to limit sodium.  Snacks: fruit mostly  Drinks: 1 coffee daily, 1 soda daily   Patient-reported exercise habits:  -Walks on the treadmill ~2x weekly    O:  Vitals:   01/18/22 1512  BP: (!) 135/90  Pulse: (!) 130   Last 3 Office BP readings: BP Readings from Last 3 Encounters:  01/18/22 (!) 135/90  12/14/21 139/85  04/28/21 (!) 146/87   BMET    Component Value Date/Time   NA 141 12/14/2021 1000   K 4.6 12/14/2021 1000   CL 100 12/14/2021 1000   CO2 26 12/14/2021 1000   GLUCOSE 87 12/14/2021 1000   GLUCOSE 91 05/25/2019 0659   BUN 17 12/14/2021 1000   CREATININE 0.86 12/14/2021 1000   CALCIUM 10.0 12/14/2021 1000   GFRNONAA >60 05/25/2019 0659   GFRAA >60 05/25/2019 0659    Renal function: CrCl cannot be calculated (Patient's most recent lab result is older than the maximum 21 days allowed.).  Clinical ASCVD: No  The ASCVD Risk score (Arnett DK, et al., 2019) failed to calculate for the following reasons:   The 2019  ASCVD risk score is only valid for ages 46 to 82  Patient is participating in a Managed Medicaid Plan:  Yes    A/P: Hypertension diagnosed currently above goal on current medications. BP goal < 130/80 mmHg. Medication adherence appears appropriate. Will add low-dose carvedilol today given his tachycardia. Will also get renal panel to assess creatinine and potassium post valsartan initiation.   -Continued valsartan 80 mg daily. -Start carvedilol 6.25 mg BID.  -Patient educated on purpose, proper use, and potential adverse effects of carvedilol.  -F/u labs ordered - CMP14+eGFR -Counseled on lifestyle modifications for blood pressure control including reduced dietary sodium, increased exercise, adequate sleep. -Encouraged patient to check BP at home and bring log of readings to next visit. Counseled on proper use of home BP cuff.    Results reviewed and written information provided.    Written patient instructions provided. Patient verbalized understanding of treatment plan.  Total time in face to face counseling 30 minutes.    Follow-up:  Pharmacist in 1 month.  Benard Halsted, PharmD, Para [place], [place] [phone]

## 2022-01-19 LAB — CMP14+EGFR
ALT: 18 IU/L (ref 0–44)
AST: 14 IU/L (ref 0–40)
Albumin/Globulin Ratio: 1.7 (ref 1.2–2.2)
Albumin: 4.3 g/dL (ref 4.1–5.1)
Alkaline Phosphatase: 120 IU/L (ref 44–121)
BUN/Creatinine Ratio: 25 — ABNORMAL HIGH (ref 9–20)
BUN: 13 mg/dL (ref 6–20)
Bilirubin Total: <0.2 mg/dL (ref 0.0–1.2)
CO2: 26 mmol/L (ref 20–29)
Calcium: 9.9 mg/dL (ref 8.7–10.2)
Chloride: 100 mmol/L (ref 96–106)
Creatinine, Ser: 0.53 mg/dL — ABNORMAL LOW (ref 0.76–1.27)
Globulin, Total: 2.6 g/dL (ref 1.5–4.5)
Glucose: 78 mg/dL (ref 70–99)
Potassium: 4.5 mmol/L (ref 3.5–5.2)
Sodium: 140 mmol/L (ref 134–144)
Total Protein: 6.9 g/dL (ref 6.0–8.5)
eGFR: 132 mL/min/{1.73_m2} (ref >59)

## 2022-01-19 NOTE — Progress Notes (Signed)
Let pt know labs from yesterday are normal

## 2022-01-21 ENCOUNTER — Telehealth: Payer: Self-pay

## 2022-01-21 NOTE — Telephone Encounter (Signed)
-----   Message from Storm Frisk, MD sent at 01/19/2022  5:53 AM EDT ----- Let pt know labs from yesterday are normal

## 2022-01-21 NOTE — Telephone Encounter (Signed)
Pt was called and is aware of results, DOB was confirmed.  ?

## 2022-02-08 ENCOUNTER — Other Ambulatory Visit: Payer: Self-pay

## 2022-02-22 ENCOUNTER — Ambulatory Visit: Payer: Medicaid Other | Attending: Pharmacist | Admitting: Pharmacist

## 2022-03-08 ENCOUNTER — Other Ambulatory Visit: Payer: Self-pay

## 2022-03-09 ENCOUNTER — Other Ambulatory Visit: Payer: Self-pay

## 2022-03-15 ENCOUNTER — Other Ambulatory Visit: Payer: Self-pay

## 2022-03-22 ENCOUNTER — Encounter (HOSPITAL_COMMUNITY): Payer: Self-pay

## 2022-03-23 ENCOUNTER — Telehealth (HOSPITAL_COMMUNITY): Payer: No Payment, Other | Admitting: Physician Assistant

## 2022-03-27 NOTE — Progress Notes (Unsigned)
BH MD Outpatient Progress Note  03/28/2022 4:46 PM Corey Smith  MRN:  [id number]  Assessment:  Corey Smith presents for follow-up evaluation. Today, 03/28/22, patient reports ongoing psychiatric stability on below regimen and is tolerating well.  He continues to maintain sobriety from all substances and regularly attend NA meetings.  No changes to plan of care at this time.  Plan to return to care in 3 months.  Identifying Information: Corey Smith is a 36 y.o. male with a historical diagnosis of bipolar 1 disorder, polysubstance use in sustained remission, HTN, tachycardia, and HLD who is an established patient with [facility] participating in follow-up via video conferencing.   Plan:  # Bipolar 1 disorder # Sleep Past medication trials: Latuda Status of problem: stable Interventions: -- Continue Trileptal 600 mg BID -- Continue Effexor XR 75 mg qAM + 150 mg qHS -- Continue Wellbutrin XL 150 mg tablet daily  -- Continue buspirone 15 mg TID -- Continue trazodone 200 mg at bedtime -- Continue hydroxyzine 25 mg TID PRN (using infrequently) -- Continue gabapentin 300 mg TID (rx by PCP)  # Opioid use disorder in sustained remission  Cocaine use disorder in sustained remission Interventions: -- Regularly attends NA meetings -- Continue to support ongoing cessation  Patient was given contact information for behavioral health clinic and was instructed to call 911 for emergencies.   Subjective:  Chief Complaint:  Chief Complaint  Patient presents with   Medication Management    Interval History:   Last seen by Otila Back, PA on 12/21/2021.  At that time, managed on: Trileptal 600 mg BID Effexor XR 225 mg daily Wellbutrin XL 150 mg tablet daily  Buspirone 15 mg TID Trazodone 200 mg at bedtime Hydroxyzine 25 mg TID PRN Gabapentin 300 mg TID (rx by PCP) During that visit, patient reported overall stability and no changes were made to  medications.  Today, he reports he has been doing well and denies any concerns. Feels current medication regimen has been working well for him and denies any side effects. Reports mood has been "stable" and denies significant anxiety. Denies SI, HI, AVH.  Sleeping about 8 hours nightly. Feels well rested with good energy and motivation throughout the day. Appetite is normal.   Last use of substances was 06/13/2019. Continues to go to [employer]. Endorses very minimal cravings/urges to return to use.   Reviewed past psychiatric history and diagnosis of bipolar - endorses history of mood instability even outside of substance use; last episode of depression was about 2 years ago. Last episode of mania was years ago as well - endorses excessively elevated mood, decreased need for sleep, racing thoughts, talkativeness during these episodes. States even now he may experience days in which he feels hypomanic in which mood is really happy and has a lot of energy but does not reach to level of dysfunction or concern.  Amenable to considering medications as prescribed.  Visit Diagnosis:    ICD-10-CM   1. Bipolar 1 disorder (HCC)  F31.9 buPROPion (WELLBUTRIN XL) 150 MG 24 hr tablet    busPIRone (BUSPAR) 15 MG tablet    oxcarbazepine (TRILEPTAL) 600 MG tablet    traZODone (DESYREL) 100 MG tablet    venlafaxine XR (EFFEXOR-XR) 150 MG 24 hr capsule    venlafaxine XR (EFFEXOR XR) 75 MG 24 hr capsule    2. Opioid use disorder, severe, in sustained remission (HCC)  F11.21     3. Cocaine use disorder, severe,  in sustained remission (HCC)  F14.21       Past Psychiatric History:  Diagnoses: bipolar 1 disorder, polysubstance use (opioids/heroin, cocaine, ecstasy, cannabis) in remission Medication trials: Latuda Hospitalizations: yes Substance use:   -- Tobacco: 1 ppd  -- Etoh: denies  -- Denies recent illicit drug use    -- Last used heroin/fentanyl, cocaine in 2021  Past Medical History:  Past  Medical History:  Diagnosis Date   ANXIETY 06/16/2010   Bipolar 1 disorder (HCC) 2018   Bipolar disorder (HCC)    FIBROMYALGIA 06/16/2010   HYPERLIPIDEMIA 06/16/2010   HYPERTRIGLYCERIDEMIA 06/16/2010   SCOLIOSIS, MILD 02/16/2007   WISDOM TEETH EXTRACTION, HX OF 02/16/2007    Past Surgical History:  Procedure Laterality Date   CORNEAL TRANSPLANT Left 2019   WISDOM TOOTH EXTRACTION Bilateral 2003    Family Psychiatric History:  Mother: anxiety  Family History:  Family History  Problem Relation Age of Onset   Breast cancer Mother    Hypertension Father    High Cholesterol Father    Depression Brother    Anxiety disorder Brother    Autism spectrum disorder Son    Hypertension Other    Anxiety disorder Other     Social History:  Social History   Socioeconomic History   Marital status: Divorced    Spouse name: Not on file   Number of children: Not on file   Years of education: Not on file   Highest education level: Not on file  Occupational History   Not on file  Tobacco Use   Smoking status: Every Day    Packs/day: 1.00    Types: Cigarettes    Start date: 2003   Smokeless tobacco: Never  Vaping Use   Vaping Use: Never used  Substance and Sexual Activity   Alcohol use: Not Currently    Comment: denies   Drug use: Not Currently    Comment: last use illicit substances 06/13/2019   Sexual activity: Yes    Partners: Female    Comment: Girlfriend is on oral contraception  Other Topics Concern   Not on file  Social History Narrative   Not on file   Social Determinants of Health   Financial Resource Strain: Not on file  Food Insecurity: Not on file  Transportation Needs: Not on file  Physical Activity: Not on file  Stress: Not on file  Social Connections: Not on file    Allergies: No Known Allergies  Current Medications: Current Outpatient Medications  Medication Sig Dispense Refill   gabapentin (NEURONTIN) 300 MG capsule Take 1 capsule (300 mg total)  by mouth 3 (three) times daily. 90 capsule 3   hydrOXYzine (ATARAX) 25 MG tablet Take 1 tablet (25 mg total) by mouth 3 (three) times daily as needed. 75 tablet 2   atorvastatin (LIPITOR) 10 MG tablet Take 1 tablet (10 mg total) by mouth daily. 90 tablet 3   buPROPion (WELLBUTRIN XL) 150 MG 24 hr tablet Take 1 tablet (150 mg total) by mouth every morning. 30 tablet 3   busPIRone (BUSPAR) 15 MG tablet Take 1 tablet (15 mg total) by mouth 3 (three) times daily. 90 tablet 3   carvedilol (COREG) 6.25 MG tablet Take 1 tablet (6.25 mg total) by mouth 2 (two) times daily with a meal. 60 tablet 2   cyclobenzaprine (FLEXERIL) 10 MG tablet Take 1 tablet (10 mg total) by mouth 3 (three) times daily as needed for muscle spasms. 30 tablet 1   nicotine (NICODERM CQ -  DOSED IN MG/24 HOURS) 21 mg/24hr patch Place 1 patch (21 mg total) onto the skin daily. 28 patch 0   oxcarbazepine (TRILEPTAL) 600 MG tablet Take 1 tablet (600 mg total) by mouth 2 (two) times daily. 60 tablet 3   traZODone (DESYREL) 100 MG tablet Take 2 tablets (200 mg total) by mouth at bedtime. 60 tablet 3   valsartan (DIOVAN) 80 MG tablet Take 1 tablet (80 mg total) by mouth daily. 90 tablet 3   venlafaxine XR (EFFEXOR XR) 75 MG 24 hr capsule Take 1 capsule (75 mg total) by mouth daily. 30 capsule 3   venlafaxine XR (EFFEXOR-XR) 150 MG 24 hr capsule Take 1 capsule (150 mg total) by mouth at bedtime. 30 capsule 3   No current facility-administered medications for this visit.    ROS: Denies any physical complaints  Objective:  Psychiatric Specialty Exam: There were no vitals taken for this visit.There is no height or weight on file to calculate BMI.  General Appearance: Casual and Well Groomed  Eye Contact:  Good  Speech:  Clear and Coherent and Normal Rate  Volume:  Normal  Mood:   "stable"  Affect:   Euthymic, pleasant  Thought Content:  Denies AVH; IOR; paranoia    Suicidal Thoughts:  No  Homicidal Thoughts:  No  Thought Process:   Goal Directed and Linear  Orientation:  Full (Time, Place, and Person)    Memory:   Grossly intact  Judgment:  Good  Insight:  Good  Concentration:  Concentration: Good  Recall:  NA  Fund of Knowledge: Good  Language: Good  Psychomotor Activity:  Normal  Akathisia:  No  AIMS (if indicated): not done  Assets:  Communication Skills Desire for Improvement Housing Intimacy Leisure Time Physical Health Resilience Social Support Talents/Skills  ADL's:  Intact  Cognition: WNL  Sleep:  Good   PE: General: sits comfortably in view of camera; no acute distress  Pulm: no increased work of breathing on room air  MSK: all extremity movements appear intact  Neuro: no focal neurological deficits observed  Gait & Station: unable to assess by video    Metabolic Disorder Labs: Lab Results  Component Value Date   HGBA1C 5.5 12/14/2021   No results found for: "PROLACTIN" Lab Results  Component Value Date   CHOL 266 (H) 12/14/2021   TRIG 342 (H) 12/14/2021   HDL 41 12/14/2021   CHOLHDL 6.5 (H) 12/14/2021   VLDL 26.2 07/11/2012   LDLCALC 160 (H) 12/14/2021   Lab Results  Component Value Date   TSH 0.389 (L) 12/14/2021   TSH 0.549 05/25/2019    Therapeutic Level Labs: No results found for: "LITHIUM" No results found for: "VALPROATE" No results found for: "CBMZ"  Screenings:  AIMS    Flowsheet Row Admission (Discharged) from OP Visit from 05/24/2019 in [facility]  AIMS Total Score 0      AUDIT    Flowsheet Row Admission (Discharged) from OP Visit from 05/24/2019 in [facility]  Alcohol Use Disorder Identification Test Final Score (AUDIT) 0      GAD-7    Flowsheet Row Video Visit from 12/21/2021 in [facility] Office Visit from 12/14/2021 in [facility] Video Visit from 09/21/2021 in [facility] Video Visit from 06/22/2021 in [facility] Video Visit from 11/05/2020 in [facility]  Total GAD-7 Score 5 12 6 4  7  [id number]    Flowsheet Row Video Visit from 12/21/2021 in [facility] Office Visit from 12/14/2021 in [facility] Video Visit from 09/21/2021 in [facility] Video Visit from 06/22/2021 in [facility] Video Visit from 11/05/2020 in [facility]  PHQ-2 Total Score 1 1 1 1 2   PHQ-9 Total Score -- 7 -- -- 8      Flowsheet Row Video Visit from 12/21/2021 in [facility] Video Visit from 09/21/2021 in [facility] Video Visit from 06/22/2021 in [facility]  C-SSRS RISK CATEGORY Low Risk Low Risk Low Risk       Collaboration of Care: Collaboration of Care: Medication Management AEB ongoing medication management and Psychiatrist AEB established with this provider  Patient/Guardian was advised Release of Information must be obtained prior to any record release in order to collaborate their care with an outside provider. Patient/Guardian was advised if they have not already done so to contact the registration department to sign all necessary forms in order for [facility] to release information regarding their care.   Consent: Patient/Guardian gives verbal consent for treatment and assignment of benefits for services provided during this visit. Patient/Guardian expressed understanding and agreed to proceed.   Televisit via video: I connected with patient on 03/28/22 at  4:00 PM EST by a video enabled telemedicine application and verified that I am speaking with the correct person using two identifiers.  Location: Patient: Home address in 03/30/22 Provider: Remote office in [place]   I discussed the limitations of evaluation and management by  telemedicine and the availability of in person appointments. The patient expressed understanding and agreed to proceed.  I discussed the assessment and treatment plan with the patient. The patient was provided an opportunity to ask questions and all were answered. The patient agreed with the plan and demonstrated an understanding of the instructions.   The patient was advised to call back or seek an in-person evaluation if the symptoms worsen or if the condition fails to improve as anticipated.  I provided 40 minutes of non-face-to-face time during this encounter.  Tayva Easterday A  03/28/2022, 4:46 PM

## 2022-03-27 NOTE — Patient Instructions (Signed)
Thank you for attending your appointment today.  -- We did not make any medication changes today. Please continue medications as prescribed.  Please do not make any changes to medications without first discussing with your provider. If you are experiencing a psychiatric emergency, please call 911 or present to your nearest emergency department. Additional crisis, medication management, and therapy resources are included below.  [facility]  [address], [place], [place] [address] [phone] WALK-IN URGENT CARE 24/7 FOR ANYONE [address], [place], [place]  [phone] Fax: [phone] [email or web address] *Interpreters available *Accepts all insurance and uninsured for Urgent Care needs *Accepts Medicaid and uninsured for outpatient treatment (below)      ONLY FOR [place] Residents  Below:    Outpatient New Patient Assessment/Therapy Walk-ins:        Monday -Thursday 8am until slots are full.        Every Friday 1pm-4pm  (first come, first served)                   New Patient Psychiatry/Medication Management        Monday-Friday 8am-11am (first come, first served)               For all walk-ins we ask that you arrive by 7:15am, because patients will be seen in the order of arrival.   

## 2022-03-28 ENCOUNTER — Telehealth (INDEPENDENT_AMBULATORY_CARE_PROVIDER_SITE_OTHER): Payer: No Payment, Other | Admitting: Psychiatry

## 2022-03-28 ENCOUNTER — Encounter (HOSPITAL_COMMUNITY): Payer: Self-pay | Admitting: Psychiatry

## 2022-03-28 DIAGNOSIS — F1421 Cocaine dependence, in remission: Secondary | ICD-10-CM | POA: Insufficient documentation

## 2022-03-28 DIAGNOSIS — F1121 Opioid dependence, in remission: Secondary | ICD-10-CM | POA: Diagnosis not present

## 2022-03-28 DIAGNOSIS — F319 Bipolar disorder, unspecified: Secondary | ICD-10-CM | POA: Diagnosis not present

## 2022-03-28 MED ORDER — VENLAFAXINE HCL ER 150 MG PO CP24: 150.0000 mg | ORAL_CAPSULE | Freq: Every evening | ORAL | 3 refills | Status: DC

## 2022-03-28 MED ORDER — OXCARBAZEPINE 600 MG PO TABS: 600.0000 mg | ORAL_TABLET | Freq: Two times a day (BID) | ORAL | 3 refills | Status: DC

## 2022-03-28 MED ORDER — TRAZODONE HCL 100 MG PO TABS: 200.0000 mg | ORAL_TABLET | Freq: Every evening | ORAL | 3 refills | Status: DC

## 2022-03-28 MED ORDER — BUPROPION HCL ER (XL) 150 MG PO TB24: 150.0000 mg | ORAL_TABLET | ORAL | 3 refills | Status: DC

## 2022-03-28 MED ORDER — VENLAFAXINE HCL ER 75 MG PO CP24: 75.0000 mg | ORAL_CAPSULE | Freq: Every day | ORAL | 3 refills | Status: DC

## 2022-03-28 MED ORDER — BUSPIRONE HCL 15 MG PO TABS: 15.0000 mg | ORAL_TABLET | Freq: Three times a day (TID) | ORAL | 3 refills | Status: DC

## 2022-04-05 ENCOUNTER — Other Ambulatory Visit: Payer: Self-pay

## 2022-04-05 ENCOUNTER — Telehealth: Payer: Self-pay | Admitting: Critical Care Medicine

## 2022-04-05 MED ORDER — GABAPENTIN 300 MG PO CAPS
300.0000 mg | ORAL_CAPSULE | Freq: Three times a day (TID) | ORAL | 3 refills | Status: DC
  Filled 2022-04-05: qty 90, 30d supply, fill #0
  Filled 2022-05-10: qty 90, 30d supply, fill #1

## 2022-04-05 NOTE — Telephone Encounter (Signed)
Medication has been refilled.

## 2022-04-05 NOTE — Telephone Encounter (Signed)
PT calling cking on script, wants Dr W to know why he had to cancel appt as lost ins and had to obtain new.

## 2022-04-19 ENCOUNTER — Ambulatory Visit: Payer: Medicaid Other | Admitting: Critical Care Medicine

## 2022-05-10 ENCOUNTER — Other Ambulatory Visit: Payer: Self-pay

## 2022-05-11 ENCOUNTER — Other Ambulatory Visit: Payer: Self-pay

## 2022-05-29 NOTE — Progress Notes (Signed)
New Patient Office Visit  Subjective    Patient ID: Corey Smith, male    DOB: 08-26-1985  Age: 37 y.o. MRN: [id number]  CC:  Chief Complaint  Patient presents with   Medication Problem    Patient stated between the Lipitor, carvedilol,and valsartan. One of them is making him exteremly tired    Medication Refill    Flexeril and gabapentin    HPI 12/14/21 Clayburn Pert presents to establish care. He was last seen by a primary care provider 5 years ago. He has history of anxiety, major depressive disorder, bipolar 1 disorder, history of opioid abuse and low back pain with sciatica. 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   Patient sees [facility] regularly and they manage his mental health medications. Currently he is on: Buspirone 15 mg TID, Trileptal 600 mg BID, Trazodone 100 mg daily and Effexor 150 mg daily. Reports he is satisfied with his medication regimen and feels well controlled.  Next appointment is 12/21/2021.   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 Methadone was stopped about 4 years ago. Last heroine use about 3 years ago.   He has a long standing history of lower back pain with sciatica pains. Pain affects both legs equally. Reports a prior MRI years ago. Also states he was on gabapentin and flexeril at one time and they substantially improved his symptoms.   Reports he has gained a considerable amount of weight. Today he is at 250 lbs, no previous weight on file. However, reports he was 180 lbs about 3 years ago. He is interested in weight loss tips.   He is concerned his blood pressure is high. Today's blood pressure is 139/85. Reports when he checks it himself, the numbers are usually right around that too.  He does smoke cigarettes, about 1 pack a day.  Lastly, he reports he has an umbilical hernia that has been present for years. His father also had one. It does not cause him pain, only mild  discomfort while lifting/certain positions, and reduces when he pushes on it.   He does not have insurance at this time, however he did obtain orange card application today.    05/31/22 The patient is seen for follow-up on arrival blood pressure elevated to 138/97 and he has not been taking his blood pressure medications.  He thought they were making her drowsy.  Note he is also on atorvastatin which may have been interacting with his mental health medications causing drowsiness.  He has no other complaints Patient does see mental health been able to stop smoking.  He has lots of mental health issues which contribute to his continued smoking use. Outpatient Encounter Medications as of 05/31/2022  Medication Sig   buPROPion (WELLBUTRIN XL) 150 MG 24 hr tablet Take 1 tablet (150 mg total) by mouth every morning.   busPIRone (BUSPAR) 15 MG tablet Take 1 tablet (15 mg total) by mouth 3 (three) times daily.   fenofibrate (TRICOR) 145 MG tablet Take 1 tablet (145 mg total) by mouth daily.   hydrOXYzine (ATARAX) 25 MG tablet Take 1 tablet (25 mg total) by mouth 3 (three) times daily as needed.   nicotine (NICODERM CQ - DOSED IN MG/24 HOURS) 21 mg/24hr patch Place 1 patch (21 mg total) onto the skin daily.   oxcarbazepine (TRILEPTAL) 600 MG tablet Take 1 tablet (600 mg total) by mouth 2 (two) times daily.   traZODone (  DESYREL) 100 MG tablet Take 2 tablets (200 mg total) by mouth at bedtime.   venlafaxine XR (EFFEXOR XR) 75 MG 24 hr capsule Take 1 capsule (75 mg total) by mouth daily.   venlafaxine XR (EFFEXOR-XR) 150 MG 24 hr capsule Take 1 capsule (150 mg total) by mouth at bedtime.   [DISCONTINUED] cyclobenzaprine (FLEXERIL) 10 MG tablet Take 1 tablet (10 mg total) by mouth 3 (three) times daily as needed for muscle spasms.   [DISCONTINUED] gabapentin (NEURONTIN) 300 MG capsule Take 1 capsule (300 mg total) by mouth 3 (three) times daily.   carvedilol (COREG) 6.25 MG tablet Take 1 tablet (6.25 mg total)  by mouth 2 (two) times daily with a meal.   cyclobenzaprine (FLEXERIL) 10 MG tablet Take 1 tablet (10 mg total) by mouth 3 (three) times daily as needed for muscle spasms.   gabapentin (NEURONTIN) 300 MG capsule Take 1 capsule (300 mg total) by mouth 3 (three) times daily.   valsartan (DIOVAN) 80 MG tablet Take 1 tablet (80 mg total) by mouth daily.   [DISCONTINUED] atorvastatin (LIPITOR) 10 MG tablet Take 1 tablet (10 mg total) by mouth daily. (Patient not taking: Reported on 05/31/2022)   [DISCONTINUED] carvedilol (COREG) 6.25 MG tablet Take 1 tablet (6.25 mg total) by mouth 2 (two) times daily with a meal. (Patient not taking: Reported on 05/31/2022)   [DISCONTINUED] valsartan (DIOVAN) 80 MG tablet Take 1 tablet (80 mg total) by mouth daily. (Patient not taking: Reported on 05/31/2022)   No facility-administered encounter medications on file as of 05/31/2022.    Past Medical History:  Diagnosis Date   ANXIETY 06/16/2010   Bipolar 1 disorder (HCC) 2018   Bipolar disorder (HCC)    FIBROMYALGIA 06/16/2010   HYPERLIPIDEMIA 06/16/2010   HYPERTRIGLYCERIDEMIA 06/16/2010   Impaired glucose tolerance 07/14/2011   SCOLIOSIS, MILD 02/16/2007   WISDOM TEETH EXTRACTION, HX OF 02/16/2007    Past Surgical History:  Procedure Laterality Date   CORNEAL TRANSPLANT Left 2019   WISDOM TOOTH EXTRACTION Bilateral 2003    Family History  Problem Relation Age of Onset   Breast cancer Mother    Hypertension Father    High Cholesterol Father    Depression Brother    Anxiety disorder Brother    Autism spectrum disorder Son    Hypertension Other    Anxiety disorder Other     Social History   Socioeconomic History   Marital status: Divorced    Spouse name: Not on file   Number of children: Not on file   Years of education: Not on file   Highest education level: Not on file  Occupational History   Not on file  Tobacco Use   Smoking status: Every Day    Packs/day: 1.00    Types: Cigarettes     Start date: 2003   Smokeless tobacco: Never  Vaping Use   Vaping Use: Never used  Substance and Sexual Activity   Alcohol use: Not Currently    Comment: denies   Drug use: Not Currently    Comment: last use illicit substances 06/13/2019   Sexual activity: Yes    Partners: Female    Comment: Girlfriend is on oral contraception  Other Topics Concern   Not on file  Social History Narrative   Not on file   Social Determinants of Health   Financial Resource Strain: Not on file  Food Insecurity: Not on file  Transportation Needs: Not on file  Physical Activity: Not on file  Stress:  Not on file  Social Connections: Not on file  Intimate Partner Violence: Not on file    Review of Systems  Constitutional: Negative.  Negative for chills, diaphoresis, fever, malaise/fatigue and weight loss.       Weight gain over 3 yrs 180>>>250#  HENT: Negative.  Negative for congestion, hearing loss, nosebleeds, sore throat and tinnitus.   Eyes: Negative.  Negative for blurred vision, photophobia and redness.  Respiratory:  Negative for cough, hemoptysis, sputum production, shortness of breath, wheezing and stridor.   Cardiovascular: Negative.  Negative for chest pain, palpitations, orthopnea, claudication, leg swelling and PND.  Gastrointestinal: Negative.  Negative for abdominal pain, blood in stool, constipation, diarrhea, heartburn, nausea and vomiting.  Genitourinary:  Negative for dysuria, flank pain, frequency, hematuria and urgency.  Musculoskeletal:  Positive for back pain. Negative for falls, joint pain, myalgias and neck pain.       Right > left sciatica  Skin: Negative.  Negative for itching and rash.  Neurological:  Negative for dizziness, tingling, tremors, sensory change, speech change, focal weakness, seizures, loss of consciousness, weakness and headaches.  Endo/Heme/Allergies:  Negative for environmental allergies and polydipsia. Does not bruise/bleed easily.  Psychiatric/Behavioral:  Negative.  Negative for depression, hallucinations, memory loss, substance abuse and suicidal ideas. The patient is not nervous/anxious and does not have insomnia.         Objective    BP (!) 138/97   Pulse 93   Wt 234 lb (106.1 kg)   SpO2 94%   BMI 36.65 kg/m   Physical Exam Constitutional:      Appearance: He is obese.  HENT:     Right Ear: Tympanic membrane, ear canal and external ear normal.     Left Ear: Tympanic membrane, ear canal and external ear normal.     Nose: Nose normal.     Mouth/Throat:     Mouth: Mucous membranes are moist.     Pharynx: Oropharynx is clear.  Eyes:     Conjunctiva/sclera: Conjunctivae normal.  Cardiovascular:     Rate and Rhythm: Normal rate and regular rhythm.     Pulses: Normal pulses.     Heart sounds: Normal heart sounds.  Pulmonary:     Effort: Pulmonary effort is normal.     Breath sounds: Normal breath sounds.  Abdominal:     General: Bowel sounds are normal.     Palpations: Abdomen is soft.     Comments: Small umbilical hernia present, easily reducible   Musculoskeletal:     Cervical back: Normal range of motion.  Skin:    General: Skin is warm.     Capillary Refill: Capillary refill takes less than 2 seconds.  Neurological:     General: No focal deficit present.     Mental Status: He is alert and oriented to person, place, and time.     Sensory: No sensory deficit.     Motor: No weakness.     Gait: Gait normal.  Psychiatric:        Mood and Affect: Mood normal.        Behavior: Behavior normal.          Assessment & Plan:   Problem List Items Addressed This Visit       Cardiovascular and Mediastinum   Primary hypertension    Primary hypertension not taking medications will resume carvedilol and valsartan as prescribed      Relevant Medications   carvedilol (COREG) 6.25 MG tablet   valsartan (DIOVAN) 80  MG tablet   fenofibrate (TRICOR) 145 MG tablet     Other   HYPERTRIGLYCERIDEMIA    Begin  fenofibrate      Relevant Medications   carvedilol (COREG) 6.25 MG tablet   valsartan (DIOVAN) 80 MG tablet   fenofibrate (TRICOR) 145 MG tablet   Hyperlipidemia    Not tolerating Lipitor we will discontinue and begin fenofibrate      Relevant Medications   carvedilol (COREG) 6.25 MG tablet   valsartan (DIOVAN) 80 MG tablet   fenofibrate (TRICOR) 145 MG tablet   Low back pain    Plan to refill gabapentin and Flexeril      Relevant Medications   cyclobenzaprine (FLEXERIL) 10 MG tablet   Bipolar 1 disorder (HCC) - Primary    Management per mental health      Tobacco abuse    Nicotine lozange and nicotine patch sent to pharmacy downstairs Diet and lifestyle handout reviewed in depth with patient today     Current smoking consumption amount: 1PPD  Dicsussion on advise to quit smoking and smoking impacts: cv impact lungs  Patient's willingness to quit:  Wants to quit  Methods to quit smoking discussed:  behav mod. Nicotine stay on welbutrin patient instructed to follow-up with mental health counselor  Medication management of smoking session drugs discussed:   Quit date not established Follow-up arranged 4 mo   Time spent counseling the patient:  5 min      Other Visit Diagnoses     Tachycardia       Relevant Medications   carvedilol (COREG) 6.25 MG tablet     Needs short-term follow-up on hypertension Return in about 6 weeks (around 07/12/2022) for htn.   Asencion Noble, MD

## 2022-05-30 ENCOUNTER — Telehealth (HOSPITAL_COMMUNITY): Payer: No Payment, Other | Admitting: Psychiatry

## 2022-05-31 ENCOUNTER — Encounter: Payer: Self-pay | Admitting: Critical Care Medicine

## 2022-05-31 ENCOUNTER — Ambulatory Visit: Payer: Commercial Managed Care - HMO | Attending: Critical Care Medicine | Admitting: Critical Care Medicine

## 2022-05-31 ENCOUNTER — Telehealth (INDEPENDENT_AMBULATORY_CARE_PROVIDER_SITE_OTHER): Payer: No Payment, Other | Admitting: Physician Assistant

## 2022-05-31 ENCOUNTER — Other Ambulatory Visit: Payer: Self-pay

## 2022-05-31 ENCOUNTER — Encounter (HOSPITAL_COMMUNITY): Payer: Self-pay | Admitting: Physician Assistant

## 2022-05-31 VITALS — BP 138/97 | HR 93 | Wt 234.0 lb

## 2022-05-31 DIAGNOSIS — F319 Bipolar disorder, unspecified: Secondary | ICD-10-CM | POA: Diagnosis not present

## 2022-05-31 DIAGNOSIS — Z760 Encounter for issue of repeat prescription: Secondary | ICD-10-CM | POA: Insufficient documentation

## 2022-05-31 DIAGNOSIS — Z6836 Body mass index (BMI) 36.0-36.9, adult: Secondary | ICD-10-CM

## 2022-05-31 DIAGNOSIS — I10 Essential (primary) hypertension: Secondary | ICD-10-CM | POA: Insufficient documentation

## 2022-05-31 DIAGNOSIS — E782 Mixed hyperlipidemia: Secondary | ICD-10-CM

## 2022-05-31 DIAGNOSIS — F17210 Nicotine dependence, cigarettes, uncomplicated: Secondary | ICD-10-CM | POA: Diagnosis not present

## 2022-05-31 DIAGNOSIS — Z720 Tobacco use: Secondary | ICD-10-CM

## 2022-05-31 DIAGNOSIS — R000 Tachycardia, unspecified: Secondary | ICD-10-CM | POA: Insufficient documentation

## 2022-05-31 DIAGNOSIS — M545 Low back pain, unspecified: Secondary | ICD-10-CM

## 2022-05-31 DIAGNOSIS — E6609 Other obesity due to excess calories: Secondary | ICD-10-CM

## 2022-05-31 DIAGNOSIS — E781 Pure hyperglyceridemia: Secondary | ICD-10-CM | POA: Insufficient documentation

## 2022-05-31 MED ORDER — TRAZODONE HCL 100 MG PO TABS: 200.0000 mg | ORAL_TABLET | Freq: Every evening | ORAL | 3 refills | Status: DC

## 2022-05-31 MED ORDER — CARVEDILOL 6.25 MG PO TABS
6.2500 mg | ORAL_TABLET | Freq: Two times a day (BID) | ORAL | 2 refills | Status: DC
  Filled 2022-05-31: qty 60, 30d supply, fill #0
  Filled 2022-07-28: qty 60, 30d supply, fill #1
  Filled 2022-10-13: qty 60, 30d supply, fill #2

## 2022-05-31 MED ORDER — BUSPIRONE HCL 15 MG PO TABS: 15.0000 mg | ORAL_TABLET | Freq: Three times a day (TID) | ORAL | 3 refills | Status: DC

## 2022-05-31 MED ORDER — VENLAFAXINE HCL ER 75 MG PO CP24: 75.0000 mg | ORAL_CAPSULE | Freq: Every day | ORAL | 3 refills | Status: DC

## 2022-05-31 MED ORDER — VENLAFAXINE HCL ER 150 MG PO CP24: 150.0000 mg | ORAL_CAPSULE | Freq: Every evening | ORAL | 3 refills | Status: DC

## 2022-05-31 MED ORDER — GABAPENTIN 300 MG PO CAPS: 300.0000 mg | ORAL_CAPSULE | Freq: Three times a day (TID) | ORAL | 3 refills | Status: DC

## 2022-05-31 MED ORDER — OXCARBAZEPINE 600 MG PO TABS: 600.0000 mg | ORAL_TABLET | Freq: Two times a day (BID) | ORAL | 3 refills | Status: DC

## 2022-05-31 MED ORDER — CYCLOBENZAPRINE HCL 10 MG PO TABS
10.0000 mg | ORAL_TABLET | Freq: Three times a day (TID) | ORAL | 1 refills | Status: DC | PRN
  Filled 2022-05-31: qty 30, 10d supply, fill #0
  Filled 2022-07-04: qty 30, 10d supply, fill #1

## 2022-05-31 MED ORDER — GABAPENTIN 300 MG PO CAPS
300.0000 mg | ORAL_CAPSULE | Freq: Three times a day (TID) | ORAL | 3 refills | Status: DC
  Filled 2022-05-31: qty 90, 30d supply, fill #0

## 2022-05-31 MED ORDER — VALSARTAN 80 MG PO TABS
80.0000 mg | ORAL_TABLET | Freq: Every day | ORAL | 3 refills | Status: DC
  Filled 2022-05-31: qty 30, 30d supply, fill #0
  Filled 2022-07-04: qty 30, 30d supply, fill #1
  Filled 2022-07-28: qty 30, 30d supply, fill #2
  Filled 2022-09-12: qty 30, 30d supply, fill #3

## 2022-05-31 MED ORDER — FENOFIBRATE 145 MG PO TABS
145.0000 mg | ORAL_TABLET | Freq: Every day | ORAL | 2 refills | Status: DC
  Filled 2022-05-31: qty 30, 30d supply, fill #0
  Filled 2022-07-04: qty 30, 30d supply, fill #1
  Filled 2022-07-28: qty 30, 30d supply, fill #2
  Filled 2022-09-12: qty 30, 30d supply, fill #3

## 2022-05-31 MED ORDER — HYDROXYZINE HCL 25 MG PO TABS: 25.0000 mg | ORAL_TABLET | Freq: Three times a day (TID) | ORAL | 2 refills | Status: DC | PRN

## 2022-05-31 MED ORDER — BUPROPION HCL ER (XL) 150 MG PO TB24: 150.0000 mg | ORAL_TABLET | ORAL | 3 refills | Status: DC

## 2022-05-31 NOTE — Assessment & Plan Note (Signed)
Plan to refill gabapentin and Flexeril

## 2022-05-31 NOTE — Assessment & Plan Note (Signed)
Primary hypertension not taking medications will resume carvedilol and valsartan as prescribed

## 2022-05-31 NOTE — Progress Notes (Signed)
BH MD/PA/NP OP Progress Note  Virtual Visit via Video Note  I connected with Corey Smith on 05/31/22 at  2:00 PM EST by a video enabled telemedicine application and verified that I am speaking with the correct person using two identifiers.  Location: Patient: Home Provider: Clinic   I discussed the limitations of evaluation and management by telemedicine and the availability of in person appointments. The patient expressed understanding and agreed to proceed.  Follow Up Instructions:   I discussed the assessment and treatment plan with the patient. The patient was provided an opportunity to ask questions and all were answered. The patient agreed with the plan and demonstrated an understanding of the instructions.   The patient was advised to call back or seek an in-person evaluation if the symptoms worsen or if the condition fails to improve as anticipated.  I provided 10 minutes of non-face-to-face time during this encounter.  Malachy Mood, PA    05/31/2022 4:45 PM Corey Smith  MRN:  [id number]  Chief Complaint:  Chief Complaint  Patient presents with   Medication Refill   Follow-up   HPI:   Corey Smith "Wes" is a 37 year old, Caucasian male with a past psychiatric history significant for bipolar 1 disorder who presents to [facility] via virtual video visit for follow up and medication management. Patient last seen by Alda Berthold, MD on 03/28/2022.  During his last encounter, patient was being managed on the following psychiatric medications:  Trileptal 600 mg 2 times daily Effexor XR 75 mg in the morning and 150 mg at bedtime Bupropion (Wellbutrin XL) 150 mg 24-hour tablet daily Buspirone 15 mg 3 times daily Trazodone 200 mg at bedtime Hydroxyzine 25 mg 3 times daily as needed Gabapentin 300 mg 3 times daily  Patient reports no issues or concerns regarding his current medication regimen.  Patient denies the need  for dosage adjustments at this time and is requesting refills on all of his medications following the conclusion of the encounter.  Patient denies depression stating that he has not been experiencing anything overwhelming as of late.  Patient states that his anxiety is manageable but at its worst, his anxiety can be a 6 or 7 out of 10.  When taking his medications, patient states that his anxiety tends to be a 3 out of 10.  Patient's current stressors include his ongoing child custody case with his ex-wife, his current wife receiving surgery on her foot, and work-related stressors.  A PHQ-9 screen was performed today with the patient scoring a 10.  A GAD-7 screen was also performed today with the patient scoring an 8.  Patient is alert and oriented x 4, calm, cooperative, and fully engaged in conversation during the encounter.  Patient denies suicidal or homicidal ideations.  He further denies auditory or visual hallucinations and does not appear to be responding to internal/external stimuli.  Patient endorses good sleep and receives on average 8 hours of sleep each night.  Patient endorses good appetite and eats on average 2 meals per day.  Patient denies alcohol consumption.  Patient endorses tobacco use and smokes on average a pack per day.  Patient denies illicit drug use stating that he has been clean for 3 years.  Visit Diagnosis:    ICD-10-CM   1. Bipolar 1 disorder (HCC)  F31.9 venlafaxine XR (EFFEXOR XR) 75 MG 24 hr capsule    oxcarbazepine (TRILEPTAL) 600 MG tablet    gabapentin (NEURONTIN)  300 MG capsule    hydrOXYzine (ATARAX) 25 MG tablet    traZODone (DESYREL) 100 MG tablet    venlafaxine XR (EFFEXOR-XR) 150 MG 24 hr capsule    buPROPion (WELLBUTRIN XL) 150 MG 24 hr tablet    busPIRone (BUSPAR) 15 MG tablet      Past Psychiatric History:  Diagnoses: bipolar 1 disorder, polysubstance use (opioids/heroin, cocaine, ecstasy, cannabis) in remission Medication trials:  [place] Hospitalizations: yes Substance use:              -- Tobacco: 1 ppd             -- Etoh: denies             -- Denies recent illicit drug use                                    -- Last used heroin/fentanyl, cocaine in 2021  Past Medical History:  Past Medical History:  Diagnosis Date   ANXIETY 06/16/2010   Bipolar 1 disorder ([place]) 2018   Bipolar disorder ([place])    FIBROMYALGIA 06/16/2010   HYPERLIPIDEMIA 06/16/2010   HYPERTRIGLYCERIDEMIA 06/16/2010   Impaired glucose tolerance 07/14/2011   SCOLIOSIS, MILD 02/16/2007   WISDOM TEETH EXTRACTION, HX OF 02/16/2007    Past Surgical History:  Procedure Laterality Date   CORNEAL TRANSPLANT Left 2019   WISDOM TOOTH EXTRACTION Bilateral 2003    Family Psychiatric History:  Mother - anxiety  Family History:  Family History  Problem Relation Age of Onset   Breast cancer Mother    Hypertension Father    High Cholesterol Father    Depression Brother    Anxiety disorder Brother    Autism spectrum disorder Son    Hypertension Other    Anxiety disorder Other     Social History:  Social History   Socioeconomic History   Marital status: Divorced    Spouse name: Not on file   Number of children: Not on file   Years of education: Not on file   Highest education level: Not on file  Occupational History   Not on file  Tobacco Use   Smoking status: Every Day    Packs/day: 1.00    Types: Cigarettes    Start date: 2003   Smokeless tobacco: Never  Vaping Use   Vaping Use: Never used  Substance and Sexual Activity   Alcohol use: Not Currently    Comment: denies   Drug use: Not Currently    Comment: last use illicit substances [id number]   Sexual activity: Yes    Partners: Female    Comment: Girlfriend is on oral contraception  Other Topics Concern   Not on file  Social History Narrative   Not on file   Social Determinants of Health   Financial Resource Strain: Not on file  Food Insecurity: Not on file   Transportation Needs: Not on file  Physical Activity: Not on file  Stress: Not on file  Social Connections: Not on file    Allergies: No Known Allergies  Metabolic Disorder Labs: Lab Results  Component Value Date   HGBA1C 5.5 12/14/2021   No results found for: "PROLACTIN" Lab Results  Component Value Date   CHOL 266 (H) 12/14/2021   TRIG 342 (H) 12/14/2021   HDL 41 12/14/2021   CHOLHDL 6.5 (H) 12/14/2021   VLDL 26.2 07/11/2012   LDLCALC 160 (H) 12/14/2021   Lab  Results  Component Value Date   TSH 0.389 (L) 12/14/2021   TSH 0.549 05/25/2019    Therapeutic Level Labs: No results found for: "LITHIUM" No results found for: "VALPROATE" No results found for: "CBMZ"  Current Medications: Current Outpatient Medications  Medication Sig Dispense Refill   buPROPion (WELLBUTRIN XL) 150 MG 24 hr tablet Take 1 tablet (150 mg total) by mouth every morning. 30 tablet 3   busPIRone (BUSPAR) 15 MG tablet Take 1 tablet (15 mg total) by mouth 3 (three) times daily. 90 tablet 3   carvedilol (COREG) 6.25 MG tablet Take 1 tablet (6.25 mg total) by mouth 2 (two) times daily with a meal. 60 tablet 2   cyclobenzaprine (FLEXERIL) 10 MG tablet Take 1 tablet (10 mg total) by mouth 3 (three) times daily as needed for muscle spasms. 30 tablet 1   fenofibrate (TRICOR) 145 MG tablet Take 1 tablet (145 mg total) by mouth daily. 90 tablet 2   gabapentin (NEURONTIN) 300 MG capsule Take 1 capsule (300 mg total) by mouth 3 (three) times daily. 90 capsule 3   hydrOXYzine (ATARAX) 25 MG tablet Take 1 tablet (25 mg total) by mouth 3 (three) times daily as needed. 75 tablet 2   nicotine (NICODERM CQ - DOSED IN MG/24 HOURS) 21 mg/24hr patch Place 1 patch (21 mg total) onto the skin daily. 28 patch 0   oxcarbazepine (TRILEPTAL) 600 MG tablet Take 1 tablet (600 mg total) by mouth 2 (two) times daily. 60 tablet 3   traZODone (DESYREL) 100 MG tablet Take 2 tablets (200 mg total) by mouth at bedtime. 60 tablet 3    valsartan (DIOVAN) 80 MG tablet Take 1 tablet (80 mg total) by mouth daily. 90 tablet 3   venlafaxine XR (EFFEXOR XR) 75 MG 24 hr capsule Take 1 capsule (75 mg total) by mouth daily. 30 capsule 3   venlafaxine XR (EFFEXOR-XR) 150 MG 24 hr capsule Take 1 capsule (150 mg total) by mouth at bedtime. 30 capsule 3   No current facility-administered medications for this visit.     Musculoskeletal: Strength & Muscle Tone: within normal limits Gait & Station: normal Patient leans: N/A  Psychiatric Specialty Exam: Review of Systems  Psychiatric/Behavioral:  Negative for decreased concentration, dysphoric mood, hallucinations, self-injury, sleep disturbance and suicidal ideas. The patient is not nervous/anxious and is not hyperactive.     There were no vitals taken for this visit.There is no height or weight on file to calculate BMI.  General Appearance: Casual  Eye Contact:  Good  Speech:  Clear and Coherent and Normal Rate  Volume:  Normal  Mood:  Euthymic  Affect:  Appropriate  Thought Process:  Coherent and Descriptions of Associations: Intact  Orientation:  Full (Time, Place, and Person)  Thought Content: WDL   Suicidal Thoughts:  No  Homicidal Thoughts:  No  Memory:  Immediate;   Good Recent;   Fair Remote;   Fair  Judgement:  Fair  Insight:  Good  Psychomotor Activity:  Normal  Concentration:  Concentration: Good and Attention Span: Good  Recall:  Good  Fund of Knowledge: Good  Language: Good  Akathisia:  No  Handed:  Right  AIMS (if indicated): not done  Assets:  Communication Skills Desire for Improvement Financial Resources/Insurance Housing Vocational/Educational  ADL's:  Intact  Cognition: WNL  Sleep:  Good   Screenings: AIMS    Flowsheet Row Admission (Discharged) from OP Visit from 05/24/2019 in [place] Total Score  0      AUDIT    Flowsheet Row Admission (Discharged) from OP Visit from 05/24/2019 in [place]  Alcohol Use Disorder Identification Test Final Score (AUDIT) 0      GAD-7    Flowsheet Row Office Visit from 05/31/2022 in [place] Video Visit from 12/21/2021 in [facility] Office Visit from 12/14/2021 in [place] Video Visit from 09/21/2021 in [facility] Video Visit from 06/22/2021 in [facility]  Total GAD-7 Score 8 5 12 6 4       PHQ2-9    [place] Office Visit from 05/31/2022 in [place] Video Visit from 12/21/2021 in [facility] Office Visit from 12/14/2021 in [place] Video Visit from 09/21/2021 in [facility] Video Visit from 06/22/2021 in [place]  PHQ-2 Total Score 2 1 1 1 1   PHQ-9 Total Score 10 -- 7 -- --      Flowsheet Row Video Visit from 05/31/2022 in [facility] Video Visit from 12/21/2021 in [facility] Video Visit from 09/21/2021 in [place] Low Risk Low Risk Low Risk        Assessment and Plan:   Corey Smith "Wes" is a 37 year old, Caucasian male with a past psychiatric history significant for bipolar 1 disorder who presents to [facility] via virtual video visit for follow up and medication management.  Patient presents today requesting refills on his current medication regimen.  Patient denies experiencing any adverse side effects on his current medication regimen and appears stable.  Patient states that his anxiety is manageable at this time and denies experiencing depression.  Patient to continue taking medications as prescribed.  Patient's medications to be e-prescribed to  pharmacy of choice.  Collaboration of Care: Collaboration of Care: Medication Management AEB provider managing patient's psychiatric medications, Primary Care Provider AEB patient being followed by primary care provider in internal medicine, and Psychiatrist AEB patient being followed by a mental health provider  Patient/Guardian was advised Release of Information must be obtained prior to any record release in order to collaborate their care with an outside provider. Patient/Guardian was advised if they have not already done so to contact the registration department to sign all necessary forms in order for [place] to release information regarding their care.   Consent: Patient/Guardian gives verbal consent for treatment and assignment of benefits for services provided during this visit. Patient/Guardian expressed understanding and agreed to proceed.   1. Bipolar 1 disorder (HCC)  - venlafaxine XR (EFFEXOR XR) 75 MG 24 hr capsule; Take 1 capsule (75 mg total) by mouth daily.  Dispense: 30 capsule; Refill: 3 - oxcarbazepine (TRILEPTAL) 600 MG tablet; Take 1 tablet (600 mg total) by mouth 2 (two) times daily.  Dispense: 60 tablet; Refill: 3 - hydrOXYzine (ATARAX) 25 MG tablet; Take 1 tablet (25 mg total) by mouth 3 (three) times daily as needed.  Dispense: 75 tablet; Refill: 2 - traZODone (DESYREL) 100 MG tablet; Take 2 tablets (200 mg total) by mouth at bedtime.  Dispense: 60 tablet; Refill: 3 - venlafaxine XR (EFFEXOR-XR) 150 MG 24 hr capsule; Take 1 capsule (150 mg total) by mouth at bedtime.  Dispense: 30 capsule; Refill:  3 - buPROPion (WELLBUTRIN XL) 150 MG 24 hr tablet; Take 1 tablet (150 mg total) by mouth every morning.  Dispense: 30 tablet; Refill: 3 - busPIRone (BUSPAR) 15 MG tablet; Take 1 tablet (15 mg total) by mouth 3 (three) times daily.  Dispense: 90 tablet; Refill: 3  Patient to follow-up in 2 months Provider spent a total of 10 minutes with the patient/reviewing patient's  chart  Meta Hatchet, PA 05/31/2022, 4:45 PM

## 2022-05-31 NOTE — Assessment & Plan Note (Signed)
Management per mental health

## 2022-05-31 NOTE — Assessment & Plan Note (Signed)
Begin fenofibrate

## 2022-05-31 NOTE — Assessment & Plan Note (Signed)
Nicotine lozange and nicotine patch sent to pharmacy downstairs Diet and lifestyle handout reviewed in depth with patient today     Current smoking consumption amount: 1PPD  Dicsussion on advise to quit smoking and smoking impacts: cv impact lungs  Patient's willingness to quit:  Wants to quit  Methods to quit smoking discussed:  behav mod. Nicotine stay on welbutrin patient instructed to follow-up with mental health counselor  Medication management of smoking session drugs discussed:   Quit date not established Follow-up arranged 4 mo   Time spent counseling the patient:  5 min

## 2022-05-31 NOTE — Patient Instructions (Signed)
  Stop Lipitor atorvastatin and begin fenofibrate 1 daily for your cholesterol this was sent downstairs to our pharmacy  Resume valsartan  and carvedilol for blood pressure  Continue to work on smoking cessation  You declined the flu vaccine  Return to see Dr. Joya Gaskins 6 weeks for blood pressure recheck

## 2022-05-31 NOTE — Assessment & Plan Note (Signed)
Not tolerating Lipitor we will discontinue and begin fenofibrate

## 2022-06-08 ENCOUNTER — Other Ambulatory Visit: Payer: Self-pay | Admitting: Critical Care Medicine

## 2022-06-08 ENCOUNTER — Other Ambulatory Visit: Payer: Self-pay

## 2022-06-08 MED ORDER — GABAPENTIN 300 MG PO CAPS
300.0000 mg | ORAL_CAPSULE | Freq: Three times a day (TID) | ORAL | 3 refills | Status: DC
  Filled 2022-06-08: qty 90, 30d supply, fill #0
  Filled 2022-07-04: qty 90, 30d supply, fill #1
  Filled 2022-07-28: qty 90, 30d supply, fill #2
  Filled 2022-08-25: qty 90, 30d supply, fill #3

## 2022-06-09 ENCOUNTER — Other Ambulatory Visit: Payer: Self-pay

## 2022-07-04 ENCOUNTER — Other Ambulatory Visit: Payer: Self-pay

## 2022-07-28 ENCOUNTER — Other Ambulatory Visit: Payer: Self-pay | Admitting: Critical Care Medicine

## 2022-07-28 ENCOUNTER — Other Ambulatory Visit: Payer: Self-pay

## 2022-07-28 NOTE — Telephone Encounter (Signed)
Requested medication (s) are due for refill today - yes  Requested medication (s) are on the active medication list -yes  Future visit scheduled -yes  Last refill: 05/31/22 #30 1RF  Notes to clinic: non delegated Rx  Requested Prescriptions  Pending Prescriptions Disp Refills   cyclobenzaprine (FLEXERIL) 10 MG tablet 30 tablet 1    Sig: Take 1 tablet (10 mg total) by mouth 3 (three) times daily as needed for muscle spasms.     Not Delegated - Analgesics:  Muscle Relaxants Failed - 07/28/2022 11:56 AM      Failed - This refill cannot be delegated      Passed - Valid encounter within last 6 months    Recent Outpatient Visits           1 month ago Primary hypertension   [place], MD   6 months ago Primary hypertension   [place], Jarome Matin, RPH-CPP   7 months ago Primary hypertension   [place], MD       Future Appointments             In 1 week Elsie Stain, MD [place]               Requested Prescriptions  Pending Prescriptions Disp Refills   cyclobenzaprine (FLEXERIL) 10 MG tablet 30 tablet 1    Sig: Take 1 tablet (10 mg total) by mouth 3 (three) times daily as needed for muscle spasms.     Not Delegated - Analgesics:  Muscle Relaxants Failed - 07/28/2022 11:56 AM      Failed - This refill cannot be delegated      Passed - Valid encounter within last 6 months    Recent Outpatient Visits           1 month ago Primary hypertension   [place], MD   6 months ago Primary hypertension   [place], Jarome Matin, RPH-CPP   7 months ago Primary hypertension   [place], MD       Future Appointments              In 1 week Elsie Stain, MD [place]

## 2022-07-29 ENCOUNTER — Other Ambulatory Visit: Payer: Self-pay

## 2022-07-29 MED ORDER — CYCLOBENZAPRINE HCL 10 MG PO TABS
10.0000 mg | ORAL_TABLET | Freq: Three times a day (TID) | ORAL | 0 refills | Status: DC | PRN
  Filled 2022-07-29: qty 30, 10d supply, fill #0

## 2022-08-02 ENCOUNTER — Encounter (HOSPITAL_COMMUNITY): Payer: Self-pay | Admitting: Physician Assistant

## 2022-08-02 ENCOUNTER — Telehealth (INDEPENDENT_AMBULATORY_CARE_PROVIDER_SITE_OTHER): Payer: Commercial Managed Care - HMO | Admitting: Physician Assistant

## 2022-08-02 DIAGNOSIS — F319 Bipolar disorder, unspecified: Secondary | ICD-10-CM | POA: Diagnosis not present

## 2022-08-02 MED ORDER — GABAPENTIN 300 MG PO CAPS: 300.0000 mg | ORAL_CAPSULE | Freq: Three times a day (TID) | ORAL | 3 refills | Status: DC

## 2022-08-02 MED ORDER — OXCARBAZEPINE 600 MG PO TABS: 600.0000 mg | ORAL_TABLET | Freq: Two times a day (BID) | ORAL | 3 refills | Status: DC

## 2022-08-02 MED ORDER — VENLAFAXINE HCL ER 150 MG PO CP24: 150.0000 mg | ORAL_CAPSULE | Freq: Every evening | ORAL | 3 refills | Status: DC

## 2022-08-02 MED ORDER — TRAZODONE HCL 100 MG PO TABS: 200.0000 mg | ORAL_TABLET | Freq: Every evening | ORAL | 3 refills | Status: DC

## 2022-08-02 MED ORDER — BUSPIRONE HCL 15 MG PO TABS: 15.0000 mg | ORAL_TABLET | Freq: Three times a day (TID) | ORAL | 3 refills | Status: DC

## 2022-08-02 MED ORDER — HYDROXYZINE HCL 25 MG PO TABS: 25.0000 mg | ORAL_TABLET | Freq: Three times a day (TID) | ORAL | 2 refills | Status: DC | PRN

## 2022-08-02 MED ORDER — VENLAFAXINE HCL ER 75 MG PO CP24: 75.0000 mg | ORAL_CAPSULE | Freq: Every day | ORAL | 3 refills | Status: DC

## 2022-08-02 MED ORDER — BUPROPION HCL ER (XL) 150 MG PO TB24: 150.0000 mg | ORAL_TABLET | ORAL | 3 refills | Status: DC

## 2022-08-02 NOTE — Progress Notes (Signed)
BH MD/PA/NP OP Progress Note  Virtual Visit via Video Note  I connected with Corey Smith on 08/02/22 at  2:00 PM EDT by a video enabled telemedicine application and verified that I am speaking with the correct person using two identifiers.  Location: Patient: Home Provider: Clinic   I discussed the limitations of evaluation and management by telemedicine and the availability of in person appointments. The patient expressed understanding and agreed to proceed.  Follow Up Instructions:   I discussed the assessment and treatment plan with the patient. The patient was provided an opportunity to ask questions and all were answered. The patient agreed with the plan and demonstrated an understanding of the instructions.   The patient was advised to call back or seek an in-person evaluation if the symptoms worsen or if the condition fails to improve as anticipated.  I provided 12 minutes of non-face-to-face time during this encounter.  Malachy Mood, PA    08/02/2022 6:31 PM Corey Smith  MRN:  [id number]  Chief Complaint:  Chief Complaint  Patient presents with   Medication Refill   Follow-up   HPI:   Corey Smith "Wes" is a 37 year old, Caucasian male with a past psychiatric history significant for bipolar 1 disorder who presents to [facility] via virtual video visit for follow up and medication management.  Patient is currently being prescribed the following psychiatric medications:  Trileptal 600 mg 2 times daily Effexor XR 75 mg in the morning and 150 mg at bedtime Bupropion (Wellbutrin XL) 150 mg 24-hour tablet daily Buspirone 15 mg 3 times daily Trazodone 200 mg at bedtime Hydroxyzine 25 mg 3 times daily as needed Gabapentin 300 mg 3 times daily  Patient reports no issues or concerns regarding his current medication regimen.  Patient denies experiencing any adverse side effects from his current medication regimen.  Patient  endorses some depression but states that it is manageable at this time.  During his depressive episodes, patient rates his depression a 4 or 5 out of 10.  Patient states that his depressive episodes occur roughly 2 days out of the week but denies his symptoms lasting the whole day.  Patient's depressive episodes are characterized by the following symptoms: lack of motivation and irritability.  Patient also endorses anxiety but states that his anxiety is manageable at this time.  Patient denies any new stressors at this time.  A PHQ-9 screen was performed with the patient scoring a 10.  A GAD-7 screen was also performed with the patient scoring a 7.  Patient is alert and oriented x 4, calm, cooperative, and fully engaged in conversation during the encounter.  Patient endorses good mood.  Patient denies suicidal or homicidal ideations.  She further denies auditory or visual hallucinations and does not appear to be responding to internal/external stimuli.  Patient endorses good sleep and receives on average 8 hours of sleep each night.  Patient endorses good appetite and eats on average 2 meals per day.  Patient denies alcohol consumption and illicit drug use.  Patient endorses tobacco use and smokes on average a pack per day.  Visit Diagnosis:    ICD-10-CM   1. Bipolar 1 disorder (HCC)  F31.9 venlafaxine XR (EFFEXOR XR) 75 MG 24 hr capsule    oxcarbazepine (TRILEPTAL) 600 MG tablet    gabapentin (NEURONTIN) 300 MG capsule    hydrOXYzine (ATARAX) 25 MG tablet    traZODone (DESYREL) 100 MG tablet    venlafaxine XR (  EFFEXOR-XR) 150 MG 24 hr capsule    buPROPion (WELLBUTRIN XL) 150 MG 24 hr tablet    busPIRone (BUSPAR) 15 MG tablet      Past Psychiatric History:  Diagnoses: bipolar 1 disorder, polysubstance use (opioids/heroin, cocaine, ecstasy, cannabis) in remission Medication trials: [place] Hospitalizations: yes Substance use:              -- Tobacco: 1 ppd             -- Etoh: denies              -- Denies recent illicit drug use                                    -- Last used heroin/fentanyl, cocaine in 2021  Past Medical History:  Past Medical History:  Diagnosis Date   ANXIETY 06/16/2010   Bipolar 1 disorder ([place]) 2018   Bipolar disorder ([place])    FIBROMYALGIA 06/16/2010   HYPERLIPIDEMIA 06/16/2010   HYPERTRIGLYCERIDEMIA 06/16/2010   Impaired glucose tolerance 07/14/2011   SCOLIOSIS, MILD 02/16/2007   WISDOM TEETH EXTRACTION, HX OF 02/16/2007    Past Surgical History:  Procedure Laterality Date   CORNEAL TRANSPLANT Left 2019   WISDOM TOOTH EXTRACTION Bilateral 2003    Family Psychiatric History:  Mother - anxiety  Family History:  Family History  Problem Relation Age of Onset   Breast cancer Mother    Hypertension Father    High Cholesterol Father    Depression Brother    Anxiety disorder Brother    Autism spectrum disorder Son    Hypertension Other    Anxiety disorder Other     Social History:  Social History   Socioeconomic History   Marital status: Divorced    Spouse name: Not on file   Number of children: Not on file   Years of education: Not on file   Highest education level: Not on file  Occupational History   Not on file  Tobacco Use   Smoking status: Every Day    Packs/day: 1    Types: Cigarettes    Start date: 2003   Smokeless tobacco: Never  Vaping Use   Vaping Use: Never used  Substance and Sexual Activity   Alcohol use: Not Currently    Comment: denies   Drug use: Not Currently    Comment: last use illicit substances [id number]   Sexual activity: Yes    Partners: Female    Comment: Girlfriend is on oral contraception  Other Topics Concern   Not on file  Social History Narrative   Not on file   Social Determinants of Health   Financial Resource Strain: Not on file  Food Insecurity: Not on file  Transportation Needs: Not on file  Physical Activity: Not on file  Stress: Not on file  Social Connections: Not on file     Allergies: No Known Allergies  Metabolic Disorder Labs: Lab Results  Component Value Date   HGBA1C 5.5 12/14/2021   No results found for: "PROLACTIN" Lab Results  Component Value Date   CHOL 266 (H) 12/14/2021   TRIG 342 (H) 12/14/2021   HDL 41 12/14/2021   CHOLHDL 6.5 (H) 12/14/2021   VLDL 26.2 07/11/2012   LDLCALC 160 (H) 12/14/2021   Lab Results  Component Value Date   TSH 0.389 (L) 12/14/2021   TSH 0.549 05/25/2019    Therapeutic Level Labs: No results  found for: "LITHIUM" No results found for: "VALPROATE" No results found for: "CBMZ"  Current Medications: Current Outpatient Medications  Medication Sig Dispense Refill   buPROPion (WELLBUTRIN XL) 150 MG 24 hr tablet Take 1 tablet (150 mg total) by mouth every morning. 30 tablet 3   busPIRone (BUSPAR) 15 MG tablet Take 1 tablet (15 mg total) by mouth 3 (three) times daily. 90 tablet 3   carvedilol (COREG) 6.25 MG tablet Take 1 tablet (6.25 mg total) by mouth 2 (two) times daily with a meal. 60 tablet 2   cyclobenzaprine (FLEXERIL) 10 MG tablet Take 1 tablet (10 mg total) by mouth 3 (three) times daily as needed for muscle spasms. 30 tablet 0   fenofibrate (TRICOR) 145 MG tablet Take 1 tablet (145 mg total) by mouth daily. 90 tablet 2   gabapentin (NEURONTIN) 300 MG capsule Take 1 capsule (300 mg total) by mouth 3 (three) times daily. 90 capsule 3   gabapentin (NEURONTIN) 300 MG capsule Take 1 capsule (300 mg total) by mouth 3 (three) times daily. 90 capsule 3   hydrOXYzine (ATARAX) 25 MG tablet Take 1 tablet (25 mg total) by mouth 3 (three) times daily as needed. 75 tablet 2   nicotine (NICODERM CQ - DOSED IN MG/24 HOURS) 21 mg/24hr patch Place 1 patch (21 mg total) onto the skin daily. 28 patch 0   oxcarbazepine (TRILEPTAL) 600 MG tablet Take 1 tablet (600 mg total) by mouth 2 (two) times daily. 60 tablet 3   traZODone (DESYREL) 100 MG tablet Take 2 tablets (200 mg total) by mouth at bedtime. 60 tablet 3   valsartan  (DIOVAN) 80 MG tablet Take 1 tablet (80 mg total) by mouth daily. 90 tablet 3   venlafaxine XR (EFFEXOR XR) 75 MG 24 hr capsule Take 1 capsule (75 mg total) by mouth daily. 30 capsule 3   venlafaxine XR (EFFEXOR-XR) 150 MG 24 hr capsule Take 1 capsule (150 mg total) by mouth at bedtime. 30 capsule 3   No current facility-administered medications for this visit.     Musculoskeletal: Strength & Muscle Tone: within normal limits Gait & Station: normal Patient leans: N/A  Psychiatric Specialty Exam: Review of Systems  Psychiatric/Behavioral:  Negative for decreased concentration, dysphoric mood, hallucinations, self-injury, sleep disturbance and suicidal ideas. The patient is nervous/anxious. The patient is not hyperactive.     There were no vitals taken for this visit.There is no height or weight on file to calculate BMI.  General Appearance: Casual  Eye Contact:  Good  Speech:  Clear and Coherent and Normal Rate  Volume:  Normal  Mood:  Anxious and Depressed  Affect:  Appropriate  Thought Process:  Coherent and Descriptions of Associations: Intact  Orientation:  Full (Time, Place, and Person)  Thought Content: WDL   Suicidal Thoughts:  No  Homicidal Thoughts:  No  Memory:  Immediate;   Good Recent;   Fair Remote;   Fair  Judgement:  Fair  Insight:  Good  Psychomotor Activity:  Normal  Concentration:  Concentration: Good and Attention Span: Good  Recall:  Good  Fund of Knowledge: Good  Language: Good  Akathisia:  No  Handed:  Right  AIMS (if indicated): not done  Assets:  Communication Skills Desire for Improvement Financial Resources/Insurance Housing Vocational/Educational  ADL's:  Intact  Cognition: WNL  Sleep:  Good   Screenings: AIMS    Flowsheet Row Admission (Discharged) from OP Visit from 05/24/2019 in [place] Total Score  0      AUDIT    Flowsheet Row Admission (Discharged) from OP Visit from 05/24/2019 in [place]  Alcohol Use Disorder Identification Test Final Score (AUDIT) 0      GAD-7    Flowsheet Row Video Visit from 08/02/2022 in [facility] Office Visit from 05/31/2022 in [place] Video Visit from 12/21/2021 in [facility] Office Visit from 12/14/2021 in [place] Video Visit from 09/21/2021 in [place]  Total GAD-7 Score 7 8 5 12 6       PHQ2-9    Flowsheet Row Video Visit from 08/02/2022 in [facility] Office Visit from 05/31/2022 in [place] Video Visit from 12/21/2021 in [facility] Office Visit from 12/14/2021 in [place] Video Visit from 09/21/2021 in [place]  PHQ-2 Total Score 3 2 1 1 1   PHQ-9 Total Score 10 10 -- 7 --      Flowsheet Row Video Visit from 08/02/2022 in [facility] Video Visit from 05/31/2022 in [facility] Video Visit from 12/21/2021 in [place] Low Risk Low Risk Low Risk        Assessment and Plan:   Corey Smith "Wes" is a 37 year old, Caucasian male with a past psychiatric history significant for bipolar 1 disorder who presents to [facility] via virtual video visit for follow up and medication management.  Patient reports no issues or concerns regarding his current medication regimen.  Patient denies the need for dosage adjustments at this time and is requesting refills on all of his medications following the conclusion of the encounter.  Patient endorses some depression and anxiety but states that his symptoms are manageable at this time.  Patient reports no other issues or  concerns at this time.  Patient's medications to be e-prescribed to pharmacy of choice.  Collaboration of Care: Collaboration of Care: Medication Management AEB provider managing patient's psychiatric medications, Primary Care Provider AEB patient being followed by primary care provider in internal medicine, and Psychiatrist AEB patient being followed by a mental health provider  Patient/Guardian was advised Release of Information must be obtained prior to any record release in order to collaborate their care with an outside provider. Patient/Guardian was advised if they have not already done so to contact the registration department to sign all necessary forms in order for [place] to release information regarding their care.   Consent: Patient/Guardian gives verbal consent for treatment and assignment of benefits for services provided during this visit. Patient/Guardian expressed understanding and agreed to proceed.   1. Bipolar 1 disorder (HCC)  - venlafaxine XR (EFFEXOR XR) 75 MG 24 hr capsule; Take 1 capsule (75 mg total) by mouth daily.  Dispense: 30 capsule; Refill: 3 - oxcarbazepine (TRILEPTAL) 600 MG tablet; Take 1 tablet (600 mg total) by mouth 2 (two) times daily.  Dispense: 60 tablet; Refill: 3 - gabapentin (NEURONTIN) 300 MG capsule; Take 1 capsule (300 mg total) by mouth 3 (three) times daily.  Dispense: 90 capsule; Refill: 3 - hydrOXYzine (ATARAX) 25 MG tablet; Take 1 tablet (25 mg total) by mouth 3 (three) times daily as needed.  Dispense: 75 tablet; Refill: 2 - traZODone (DESYREL) 100 MG tablet;  Take 2 tablets (200 mg total) by mouth at bedtime.  Dispense: 60 tablet; Refill: 3 - venlafaxine XR (EFFEXOR-XR) 150 MG 24 hr capsule; Take 1 capsule (150 mg total) by mouth at bedtime.  Dispense: 30 capsule; Refill: 3 - buPROPion (WELLBUTRIN XL) 150 MG 24 hr tablet; Take 1 tablet (150 mg total) by mouth every morning.  Dispense: 30 tablet; Refill: 3 - busPIRone (BUSPAR) 15 MG tablet; Take 1  tablet (15 mg total) by mouth 3 (three) times daily.  Dispense: 90 tablet; Refill: 3  Patient to follow-up in 3 months Provider spent a total of 12 minutes with the patient/reviewing patient's chart  Malachy Mood, PA 08/02/2022, 6:31 PM

## 2022-08-04 ENCOUNTER — Other Ambulatory Visit: Payer: Self-pay

## 2022-08-08 NOTE — Progress Notes (Deleted)
New Patient Office Visit  Subjective    Patient ID: Corey Smith, male    DOB: [birth year]  Age: 37 y.o. MRN: [id number]  CC:  No chief complaint on file.   HPI 12/14/21 Corey Smith presents to establish care. He was last seen by a primary care provider 5 years ago. He has history of anxiety, major depressive disorder, bipolar 1 disorder, history of opioid abuse and low back pain with sciatica. 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   Patient sees [facility] regularly and they manage his mental health medications. Currently he is on: Buspirone 15 mg TID, Trileptal 600 mg BID, Trazodone 100 mg daily and Effexor 150 mg daily. Reports he is satisfied with his medication regimen and feels well controlled.  Next appointment is 12/21/2021.   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 Methadone was stopped about 4 years ago. Last heroine use about 3 years ago.   He has a long standing history of lower back pain with sciatica pains. Pain affects both legs equally. Reports a prior MRI years ago. Also states he was on gabapentin and flexeril at one time and they substantially improved his symptoms.   Reports he has gained a considerable amount of weight. Today he is at 250 lbs, no previous weight on file. However, reports he was 180 lbs about 3 years ago. He is interested in weight loss tips.   He is concerned his blood pressure is high. Today's blood pressure is 139/85. Reports when he checks it himself, the numbers are usually right around that too.  He does smoke cigarettes, about 1 pack a day.  Lastly, he reports he has an umbilical hernia that has been present for years. His father also had one. It does not cause him pain, only mild discomfort while lifting/certain positions, and reduces when he pushes on it.   He does not have insurance at this time, however he did obtain orange card application today.    05/31/22 The  patient is seen for follow-up on arrival blood pressure elevated to 138/97 and he has not been taking his blood pressure medications.  He thought they were making her drowsy.  Note he is also on atorvastatin which may have been interacting with his mental health medications causing drowsiness.  He has no other complaints Patient does see mental health been able to stop smoking.  He has lots of mental health issues which contribute to his continued smoking use. Outpatient Encounter Medications as of 08/09/2022  Medication Sig   buPROPion (WELLBUTRIN XL) 150 MG 24 hr tablet Take 1 tablet (150 mg total) by mouth every morning.   busPIRone (BUSPAR) 15 MG tablet Take 1 tablet (15 mg total) by mouth 3 (three) times daily.   carvedilol (COREG) 6.25 MG tablet Take 1 tablet (6.25 mg total) by mouth 2 (two) times daily with a meal.   cyclobenzaprine (FLEXERIL) 10 MG tablet Take 1 tablet (10 mg total) by mouth 3 (three) times daily as needed for muscle spasms.   fenofibrate (TRICOR) 145 MG tablet Take 1 tablet (145 mg total) by mouth daily.   gabapentin (NEURONTIN) 300 MG capsule Take 1 capsule (300 mg total) by mouth 3 (three) times daily.   gabapentin (NEURONTIN) 300 MG capsule Take 1 capsule (300 mg total) by mouth 3 (three) times daily.   hydrOXYzine (ATARAX) 25 MG tablet Take 1 tablet (25 mg total) by mouth 3 (three)  times daily as needed.   nicotine (NICODERM CQ - DOSED IN MG/24 HOURS) 21 mg/24hr patch Place 1 patch (21 mg total) onto the skin daily.   oxcarbazepine (TRILEPTAL) 600 MG tablet Take 1 tablet (600 mg total) by mouth 2 (two) times daily.   traZODone (DESYREL) 100 MG tablet Take 2 tablets (200 mg total) by mouth at bedtime.   valsartan (DIOVAN) 80 MG tablet Take 1 tablet (80 mg total) by mouth daily.   venlafaxine XR (EFFEXOR XR) 75 MG 24 hr capsule Take 1 capsule (75 mg total) by mouth daily.   venlafaxine XR (EFFEXOR-XR) 150 MG 24 hr capsule Take 1 capsule (150 mg total) by mouth at bedtime.    No facility-administered encounter medications on file as of 08/09/2022.    Past Medical History:  Diagnosis Date   ANXIETY 06/16/2010   Bipolar 1 disorder ([place]) 2018   Bipolar disorder ([place])    FIBROMYALGIA 06/16/2010   HYPERLIPIDEMIA 06/16/2010   HYPERTRIGLYCERIDEMIA 06/16/2010   Impaired glucose tolerance 07/14/2011   SCOLIOSIS, MILD 02/16/2007   WISDOM TEETH EXTRACTION, HX OF 02/16/2007    Past Surgical History:  Procedure Laterality Date   CORNEAL TRANSPLANT Left 2019   WISDOM TOOTH EXTRACTION Bilateral 2003    Family History  Problem Relation Age of Onset   Breast cancer Mother    Hypertension Father    High Cholesterol Father    Depression Brother    Anxiety disorder Brother    Autism spectrum disorder Son    Hypertension Other    Anxiety disorder Other     Social History   Socioeconomic History   Marital status: Divorced    Spouse name: Not on file   Number of children: Not on file   Years of education: Not on file   Highest education level: Not on file  Occupational History   Not on file  Tobacco Use   Smoking status: Every Day    Packs/day: 1    Types: Cigarettes    Start date: 2003   Smokeless tobacco: Never  Vaping Use   Vaping Use: Never used  Substance and Sexual Activity   Alcohol use: Not Currently    Comment: denies   Drug use: Not Currently    Comment: last use illicit substances 123XX123   Sexual activity: Yes    Partners: Female    Comment: Girlfriend is on oral contraception  Other Topics Concern   Not on file  Social History Narrative   Not on file   Social Determinants of Health   Financial Resource Strain: Not on file  Food Insecurity: Not on file  Transportation Needs: Not on file  Physical Activity: Not on file  Stress: Not on file  Social Connections: Not on file  Intimate Partner Violence: Not on file    Review of Systems  Constitutional: Negative.  Negative for chills, diaphoresis, fever, malaise/fatigue and  weight loss.       Weight gain over 3 yrs 180>>>250#  HENT: Negative.  Negative for congestion, hearing loss, nosebleeds, sore throat and tinnitus.   Eyes: Negative.  Negative for blurred vision, photophobia and redness.  Respiratory:  Negative for cough, hemoptysis, sputum production, shortness of breath, wheezing and stridor.   Cardiovascular: Negative.  Negative for chest pain, palpitations, orthopnea, claudication, leg swelling and PND.  Gastrointestinal: Negative.  Negative for abdominal pain, blood in stool, constipation, diarrhea, heartburn, nausea and vomiting.  Genitourinary:  Negative for dysuria, flank pain, frequency, hematuria and urgency.  Musculoskeletal:  Positive for back pain. Negative for falls, joint pain, myalgias and neck pain.       Right > left sciatica  Skin: Negative.  Negative for itching and rash.  Neurological:  Negative for dizziness, tingling, tremors, sensory change, speech change, focal weakness, seizures, loss of consciousness, weakness and headaches.  Endo/Heme/Allergies:  Negative for environmental allergies and polydipsia. Does not bruise/bleed easily.  Psychiatric/Behavioral: Negative.  Negative for depression, hallucinations, memory loss, substance abuse and suicidal ideas. The patient is not nervous/anxious and does not have insomnia.         Objective    There were no vitals taken for this visit.  Physical Exam Constitutional:      Appearance: He is obese.  HENT:     Right Ear: Tympanic membrane, ear canal and external ear normal.     Left Ear: Tympanic membrane, ear canal and external ear normal.     Nose: Nose normal.     Mouth/Throat:     Mouth: Mucous membranes are moist.     Pharynx: Oropharynx is clear.  Eyes:     Conjunctiva/sclera: Conjunctivae normal.  Cardiovascular:     Rate and Rhythm: Normal rate and regular rhythm.     Pulses: Normal pulses.     Heart sounds: Normal heart sounds.  Pulmonary:     Effort: Pulmonary effort is  normal.     Breath sounds: Normal breath sounds.  Abdominal:     General: Bowel sounds are normal.     Palpations: Abdomen is soft.     Comments: Small umbilical hernia present, easily reducible   Musculoskeletal:     Cervical back: Normal range of motion.  Skin:    General: Skin is warm.     Capillary Refill: Capillary refill takes less than 2 seconds.  Neurological:     General: No focal deficit present.     Mental Status: He is alert and oriented to person, place, and time.     Sensory: No sensory deficit.     Motor: No weakness.     Gait: Gait normal.  Psychiatric:        Mood and Affect: Mood normal.        Behavior: Behavior normal.          Assessment & Plan:   Problem List Items Addressed This Visit   None Needs short-term follow-up on hypertension No follow-ups on file.   Corey Noble, MD

## 2022-08-09 ENCOUNTER — Ambulatory Visit: Payer: Commercial Managed Care - HMO | Admitting: Critical Care Medicine

## 2022-08-14 NOTE — Progress Notes (Deleted)
New Patient Office Visit  Subjective    Patient ID: Corey Smith, male    DOB: 07-11-1985  Age: 37 y.o. MRN: [id number]  CC:  No chief complaint on file.   HPI 12/14/21 Corey Smith presents to establish care. He was last seen by a primary care provider 5 years ago. He has history of anxiety, major depressive disorder, bipolar 1 disorder, history of opioid abuse and low back pain with sciatica. 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   Patient sees [facility] regularly and they manage his mental health medications. Currently he is on: Buspirone 15 mg TID, Trileptal 600 mg BID, Trazodone 100 mg daily and Effexor 150 mg daily. Reports he is satisfied with his medication regimen and feels well controlled.  Next appointment is 12/21/2021.   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 Methadone was stopped about 4 years ago. Last heroine use about 3 years ago.   He has a long standing history of lower back pain with sciatica pains. Pain affects both legs equally. Reports a prior MRI years ago. Also states he was on gabapentin and flexeril at one time and they substantially improved his symptoms.   Reports he has gained a considerable amount of weight. Today he is at 250 lbs, no previous weight on file. However, reports he was 180 lbs about 3 years ago. He is interested in weight loss tips.   He is concerned his blood pressure is high. Today's blood pressure is 139/85. Reports when he checks it himself, the numbers are usually right around that too.  He does smoke cigarettes, about 1 pack a day.  Lastly, he reports he has an umbilical hernia that has been present for years. His father also had one. It does not cause him pain, only mild discomfort while lifting/certain positions, and reduces when he pushes on it.   He does not have insurance at this time, however he did obtain orange card application today.    05/31/22 The  patient is seen for follow-up on arrival blood pressure elevated to 138/97 and he has not been taking his blood pressure medications.  He thought they were making her drowsy.  Note he is also on atorvastatin which may have been interacting with his mental health medications causing drowsiness.  He has no other complaints Patient does see mental health been able to stop smoking.  He has lots of mental health issues which contribute to his continued smoking use. Outpatient Encounter Medications as of 08/16/2022  Medication Sig   buPROPion (WELLBUTRIN XL) 150 MG 24 hr tablet Take 1 tablet (150 mg total) by mouth every morning.   busPIRone (BUSPAR) 15 MG tablet Take 1 tablet (15 mg total) by mouth 3 (three) times daily.   carvedilol (COREG) 6.25 MG tablet Take 1 tablet (6.25 mg total) by mouth 2 (two) times daily with a meal.   cyclobenzaprine (FLEXERIL) 10 MG tablet Take 1 tablet (10 mg total) by mouth 3 (three) times daily as needed for muscle spasms.   fenofibrate (TRICOR) 145 MG tablet Take 1 tablet (145 mg total) by mouth daily.   gabapentin (NEURONTIN) 300 MG capsule Take 1 capsule (300 mg total) by mouth 3 (three) times daily.   gabapentin (NEURONTIN) 300 MG capsule Take 1 capsule (300 mg total) by mouth 3 (three) times daily.   hydrOXYzine (ATARAX) 25 MG tablet Take 1 tablet (25 mg total) by mouth 3 (three)  times daily as needed.   nicotine (NICODERM CQ - DOSED IN MG/24 HOURS) 21 mg/24hr patch Place 1 patch (21 mg total) onto the skin daily.   oxcarbazepine (TRILEPTAL) 600 MG tablet Take 1 tablet (600 mg total) by mouth 2 (two) times daily.   traZODone (DESYREL) 100 MG tablet Take 2 tablets (200 mg total) by mouth at bedtime.   valsartan (DIOVAN) 80 MG tablet Take 1 tablet (80 mg total) by mouth daily.   venlafaxine XR (EFFEXOR XR) 75 MG 24 hr capsule Take 1 capsule (75 mg total) by mouth daily.   venlafaxine XR (EFFEXOR-XR) 150 MG 24 hr capsule Take 1 capsule (150 mg total) by mouth at bedtime.    No facility-administered encounter medications on file as of 08/16/2022.    Past Medical History:  Diagnosis Date   ANXIETY 06/16/2010   Bipolar 1 disorder (HCC) 2018   Bipolar disorder (HCC)    FIBROMYALGIA 06/16/2010   HYPERLIPIDEMIA 06/16/2010   HYPERTRIGLYCERIDEMIA 06/16/2010   Impaired glucose tolerance 07/14/2011   SCOLIOSIS, MILD 02/16/2007   WISDOM TEETH EXTRACTION, HX OF 02/16/2007    Past Surgical History:  Procedure Laterality Date   CORNEAL TRANSPLANT Left 2019   WISDOM TOOTH EXTRACTION Bilateral 2003    Family History  Problem Relation Age of Onset   Breast cancer Mother    Hypertension Father    High Cholesterol Father    Depression Brother    Anxiety disorder Brother    Autism spectrum disorder Son    Hypertension Other    Anxiety disorder Other     Social History   Socioeconomic History   Marital status: Divorced    Spouse name: Not on file   Number of children: Not on file   Years of education: Not on file   Highest education level: Not on file  Occupational History   Not on file  Tobacco Use   Smoking status: Every Day    Packs/day: 1    Types: Cigarettes    Start date: 2003   Smokeless tobacco: Never  Vaping Use   Vaping Use: Never used  Substance and Sexual Activity   Alcohol use: Not Currently    Comment: denies   Drug use: Not Currently    Comment: last use illicit substances 06/13/2019   Sexual activity: Yes    Partners: Female    Comment: Girlfriend is on oral contraception  Other Topics Concern   Not on file  Social History Narrative   Not on file   Social Determinants of Health   Financial Resource Strain: Not on file  Food Insecurity: Not on file  Transportation Needs: Not on file  Physical Activity: Not on file  Stress: Not on file  Social Connections: Not on file  Intimate Partner Violence: Not on file    Review of Systems  Constitutional: Negative.  Negative for chills, diaphoresis, fever, malaise/fatigue and  weight loss.       Weight gain over 3 yrs 180>>>250#  HENT: Negative.  Negative for congestion, hearing loss, nosebleeds, sore throat and tinnitus.   Eyes: Negative.  Negative for blurred vision, photophobia and redness.  Respiratory:  Negative for cough, hemoptysis, sputum production, shortness of breath, wheezing and stridor.   Cardiovascular: Negative.  Negative for chest pain, palpitations, orthopnea, claudication, leg swelling and PND.  Gastrointestinal: Negative.  Negative for abdominal pain, blood in stool, constipation, diarrhea, heartburn, nausea and vomiting.  Genitourinary:  Negative for dysuria, flank pain, frequency, hematuria and urgency.  Musculoskeletal:  Positive for back pain. Negative for falls, joint pain, myalgias and neck pain.       Right > left sciatica  Skin: Negative.  Negative for itching and rash.  Neurological:  Negative for dizziness, tingling, tremors, sensory change, speech change, focal weakness, seizures, loss of consciousness, weakness and headaches.  Endo/Heme/Allergies:  Negative for environmental allergies and polydipsia. Does not bruise/bleed easily.  Psychiatric/Behavioral: Negative.  Negative for depression, hallucinations, memory loss, substance abuse and suicidal ideas. The patient is not nervous/anxious and does not have insomnia.         Objective    There were no vitals taken for this visit.  Physical Exam Constitutional:      Appearance: He is obese.  HENT:     Right Ear: Tympanic membrane, ear canal and external ear normal.     Left Ear: Tympanic membrane, ear canal and external ear normal.     Nose: Nose normal.     Mouth/Throat:     Mouth: Mucous membranes are moist.     Pharynx: Oropharynx is clear.  Eyes:     Conjunctiva/sclera: Conjunctivae normal.  Cardiovascular:     Rate and Rhythm: Normal rate and regular rhythm.     Pulses: Normal pulses.     Heart sounds: Normal heart sounds.  Pulmonary:     Effort: Pulmonary effort is  normal.     Breath sounds: Normal breath sounds.  Abdominal:     General: Bowel sounds are normal.     Palpations: Abdomen is soft.     Comments: Small umbilical hernia present, easily reducible   Musculoskeletal:     Cervical back: Normal range of motion.  Skin:    General: Skin is warm.     Capillary Refill: Capillary refill takes less than 2 seconds.  Neurological:     General: No focal deficit present.     Mental Status: He is alert and oriented to person, place, and time.     Sensory: No sensory deficit.     Motor: No weakness.     Gait: Gait normal.  Psychiatric:        Mood and Affect: Mood normal.        Behavior: Behavior normal.          Assessment & Plan:   Problem List Items Addressed This Visit   None Needs short-term follow-up on hypertension No follow-ups on file.   Asencion Noble, MD

## 2022-08-16 ENCOUNTER — Ambulatory Visit: Payer: Commercial Managed Care - HMO | Admitting: Critical Care Medicine

## 2022-08-25 ENCOUNTER — Other Ambulatory Visit: Payer: Self-pay

## 2022-09-19 ENCOUNTER — Other Ambulatory Visit: Payer: Self-pay

## 2022-10-05 ENCOUNTER — Other Ambulatory Visit: Payer: Self-pay

## 2022-10-13 ENCOUNTER — Other Ambulatory Visit: Payer: Self-pay

## 2022-10-26 ENCOUNTER — Ambulatory Visit: Payer: Self-pay

## 2022-10-26 NOTE — Telephone Encounter (Signed)
Call placed to patient unable to reach message left on VM.  Call to advise that he go to [facility] to be tested.

## 2022-10-26 NOTE — Telephone Encounter (Signed)
      Chief Complaint: Weakness, loss of taste. Thinks he may have COVID, does not have a home test. Asking to be worked in. No availability. Symptoms: Above Frequency: 3 days ago Pertinent Negatives: Patient denies fever, cough Disposition: [] ED /[] Urgent Care (no appt availability in office) / [] Appointment(In office/virtual)/ []  [name]/ [] Home Care/ [] Refused Recommended Disposition /[] [facility]/ [x]  Follow-up with PCP Additional Notes: Please advise pt.   Reason for Disposition  [1] MODERATE weakness (i.e., interferes with work, school, normal activities) AND [2] cause unknown  (Exceptions: Weakness from acute minor illness or poor fluid intake; weakness is chronic and not worse.)  Answer Assessment - Initial Assessment Questions 1. DESCRIPTION: "Describe how you are feeling."     Weakness, taste is off 2. SEVERITY: "How bad is it?"  "Can you stand and walk?"   - MILD (0-3): Feels weak or tired, but does not interfere with work, school or normal activities.   - MODERATE (4-7): Able to stand and walk; weakness interferes with work, school, or normal activities.   - SEVERE (8-10): Unable to stand or walk; unable to do usual activities.     Mild 3. ONSET: "When did these symptoms begin?" (e.g., hours, days, weeks, months)     3 days ago 4. CAUSE: "What do you think is causing the weakness or fatigue?" (e.g., not drinking enough fluids, medical problem, trouble sleeping)     Maybe COVID  5. NEW MEDICINES:  "Have you started on any new medicines recently?" (e.g., opioid pain medicines, benzodiazepines, muscle relaxants, antidepressants, antihistamines, neuroleptics, beta blockers)     No 6. OTHER SYMPTOMS: "Do you have any other symptoms?" (e.g., chest pain, fever, cough, SOB, vomiting, diarrhea, bleeding, other areas of pain)     Back pain 7. PREGNANCY: "Is there any chance you are pregnant?" "When was your last menstrual period?"     N/a  Protocols  used: Weakness (Generalized) and Fatigue-A-AH

## 2022-11-01 ENCOUNTER — Telehealth (HOSPITAL_COMMUNITY): Payer: Commercial Managed Care - HMO | Admitting: Physician Assistant

## 2022-11-02 ENCOUNTER — Other Ambulatory Visit: Payer: Self-pay | Admitting: Critical Care Medicine

## 2022-11-02 DIAGNOSIS — I10 Essential (primary) hypertension: Secondary | ICD-10-CM

## 2022-11-02 DIAGNOSIS — R000 Tachycardia, unspecified: Secondary | ICD-10-CM

## 2022-11-08 ENCOUNTER — Telehealth (INDEPENDENT_AMBULATORY_CARE_PROVIDER_SITE_OTHER): Payer: Commercial Managed Care - HMO | Admitting: Physician Assistant

## 2022-11-08 ENCOUNTER — Encounter (HOSPITAL_COMMUNITY): Payer: Self-pay | Admitting: Physician Assistant

## 2022-11-08 DIAGNOSIS — F319 Bipolar disorder, unspecified: Secondary | ICD-10-CM | POA: Diagnosis not present

## 2022-11-08 MED ORDER — BUSPIRONE HCL 15 MG PO TABS: 15.0000 mg | ORAL_TABLET | Freq: Three times a day (TID) | ORAL | 3 refills | Status: DC

## 2022-11-08 MED ORDER — TRAZODONE HCL 100 MG PO TABS
200.0000 mg | ORAL_TABLET | Freq: Every evening | ORAL | 3 refills | Status: AC
Start: 2022-11-08 — End: 2023-03-08

## 2022-11-08 MED ORDER — OXCARBAZEPINE 600 MG PO TABS
600.0000 mg | ORAL_TABLET | Freq: Two times a day (BID) | ORAL | 3 refills | Status: AC
Start: 2022-11-08 — End: 2023-03-08

## 2022-11-08 MED ORDER — VENLAFAXINE HCL ER 75 MG PO CP24
75.0000 mg | ORAL_CAPSULE | Freq: Every day | ORAL | 3 refills | Status: AC
Start: 2022-11-08 — End: 2023-03-08

## 2022-11-08 MED ORDER — VENLAFAXINE HCL ER 150 MG PO CP24: 150.0000 mg | ORAL_CAPSULE | Freq: Every evening | ORAL | 3 refills | Status: DC

## 2022-11-08 MED ORDER — GABAPENTIN 300 MG PO CAPS: 300.0000 mg | ORAL_CAPSULE | Freq: Three times a day (TID) | ORAL | 3 refills | Status: DC

## 2022-11-08 MED ORDER — HYDROXYZINE HCL 25 MG PO TABS: 25.0000 mg | ORAL_TABLET | Freq: Three times a day (TID) | ORAL | 2 refills | Status: DC | PRN

## 2022-11-08 MED ORDER — BUPROPION HCL ER (XL) 150 MG PO TB24: 150.0000 mg | ORAL_TABLET | ORAL | 3 refills | Status: DC

## 2022-11-08 NOTE — Progress Notes (Signed)
BH MD/PA/NP OP Progress Note  Virtual Visit via Video Note  I connected with Corey Smith on 11/08/22 at  4:00 PM EDT by a video enabled telemedicine application and verified that I am speaking with the correct person using two identifiers.  Location: Patient: Home Provider: Clinic   I discussed the limitations of evaluation and management by telemedicine and the availability of in person appointments. The patient expressed understanding and agreed to proceed.  Follow Up Instructions:   I discussed the assessment and treatment plan with the patient. The patient was provided an opportunity to ask questions and all were answered. The patient agreed with the plan and demonstrated an understanding of the instructions.   The patient was advised to call back or seek an in-person evaluation if the symptoms worsen or if the condition fails to improve as anticipated.  I provided 10 minutes of non-face-to-face time during this encounter.  Meta Hatchet, PA    11/08/2022 8:06 PM Corey Smith  MRN:  [id number]  Chief Complaint:  Chief Complaint  Patient presents with   Follow-up   Medication Refill   HPI:   Corey Smith "Corey Smith" is a 37 year old, Caucasian male with a past psychiatric history significant for bipolar 1 disorder who presents to [facility] via virtual video visit for follow up and medication management.  Patient is currently being prescribed the following psychiatric medications:  Trileptal 600 mg 2 times daily Effexor XR 75 mg in the morning and 150 mg at bedtime Bupropion (Wellbutrin XL) 150 mg 24-hour tablet daily Buspirone 15 mg 3 times daily Trazodone 200 mg at bedtime Hydroxyzine 25 mg 3 times daily as needed Gabapentin 300 mg 3 times daily  Patient reports no issues or concerns regarding his current medication regimen.  Patient denies experiencing any adverse side effects and further denies the need for dosage  adjustments at this time.  Patient denies depression.  He endorses some anxiety but states that it is manageable at this time.  The only stressor that the patient is currently dealing with is an ongoing custody battle with his ex-wife that has been going on for 3 years.  Patient endorses stability over his current medication regimen.  A PHQ-9 screen was performed with the patient scoring a 1.  A GAD-7 screen was also performed with the patient scoring a 6.  Patient is alert and oriented x 4, calm, cooperative, and fully engaged in conversation during the encounter.  Patient endorses good mood with no complaints at this time.  Patient denies suicidal or homicidal ideations.  He further denies auditory or visual hallucinations and does not appear to be responding to internal/external stimuli.  Patient endorses good sleep and receives on average 8 hours of sleep each night.  Patient endorses good appetite and eats on average 2-3 meals per day.  Patient endorses alcohol consumption sparingly.  Patient endorses tobacco use and smokes on average a pack per day.  Patient denies illicit drug use.  Visit Diagnosis:    ICD-10-CM   1. Bipolar 1 disorder (HCC)  F31.9 buPROPion (WELLBUTRIN XL) 150 MG 24 hr tablet    venlafaxine XR (EFFEXOR-XR) 150 MG 24 hr capsule    busPIRone (BUSPAR) 15 MG tablet    traZODone (DESYREL) 100 MG tablet    hydrOXYzine (ATARAX) 25 MG tablet    gabapentin (NEURONTIN) 300 MG capsule    oxcarbazepine (TRILEPTAL) 600 MG tablet    venlafaxine XR (EFFEXOR XR) 75 MG 24  hr capsule      Past Psychiatric History:  Diagnoses: bipolar 1 disorder, polysubstance use (opioids/heroin, cocaine, ecstasy, cannabis) in remission Medication trials: [place] Hospitalizations: yes Substance use:              -- Tobacco: 1 ppd             -- Etoh: denies             -- Denies recent illicit drug use                                    -- Last used heroin/fentanyl, cocaine in 2021  Past Medical  History:  Past Medical History:  Diagnosis Date   ANXIETY 06/16/2010   Bipolar 1 disorder (HCC) 2018   Bipolar disorder (HCC)    FIBROMYALGIA 06/16/2010   HYPERLIPIDEMIA 06/16/2010   HYPERTRIGLYCERIDEMIA 06/16/2010   Impaired glucose tolerance 07/14/2011   SCOLIOSIS, MILD 02/16/2007   WISDOM TEETH EXTRACTION, HX OF 02/16/2007    Past Surgical History:  Procedure Laterality Date   CORNEAL TRANSPLANT Left 2019   WISDOM TOOTH EXTRACTION Bilateral 2003    Family Psychiatric History:  Mother - anxiety  Family History:  Family History  Problem Relation Age of Onset   Breast cancer Mother    Hypertension Father    High Cholesterol Father    Depression Brother    Anxiety disorder Brother    Autism spectrum disorder Son    Hypertension Other    Anxiety disorder Other     Social History:  Social History   Socioeconomic History   Marital status: Divorced    Spouse name: Not on file   Number of children: Not on file   Years of education: Not on file   Highest education level: Not on file  Occupational History   Not on file  Tobacco Use   Smoking status: Every Day    Packs/day: 1    Types: Cigarettes    Start date: 2003   Smokeless tobacco: Never  Vaping Use   Vaping Use: Never used  Substance and Sexual Activity   Alcohol use: Not Currently    Comment: denies   Drug use: Not Currently    Comment: last use illicit substances 06/13/2019   Sexual activity: Yes    Partners: Female    Comment: Girlfriend is on oral contraception  Other Topics Concern   Not on file  Social History Narrative   Not on file   Social Determinants of Health   Financial Resource Strain: Not on file  Food Insecurity: Not on file  Transportation Needs: Not on file  Physical Activity: Not on file  Stress: Not on file  Social Connections: Not on file    Allergies: No Known Allergies  Metabolic Disorder Labs: Lab Results  Component Value Date   HGBA1C 5.5 12/14/2021   No results  found for: "PROLACTIN" Lab Results  Component Value Date   CHOL 266 (H) 12/14/2021   TRIG 342 (H) 12/14/2021   HDL 41 12/14/2021   CHOLHDL 6.5 (H) 12/14/2021   VLDL 26.2 07/11/2012   LDLCALC 160 (H) 12/14/2021   Lab Results  Component Value Date   TSH 0.389 (L) 12/14/2021   TSH 0.549 05/25/2019    Therapeutic Level Labs: No results found for: "LITHIUM" No results found for: "VALPROATE" No results found for: "CBMZ"  Current Medications: Current Outpatient Medications  Medication Sig Dispense  Refill   buPROPion (WELLBUTRIN XL) 150 MG 24 hr tablet Take 1 tablet (150 mg total) by mouth every morning. 30 tablet 3   busPIRone (BUSPAR) 15 MG tablet Take 1 tablet (15 mg total) by mouth 3 (three) times daily. 90 tablet 3   carvedilol (COREG) 6.25 MG tablet TAKE 1 TABLET BY MOUTH TWICE A DAY WITH A MEAL 60 tablet 2   cyclobenzaprine (FLEXERIL) 10 MG tablet TAKE 1 TABLET BY MOUTH THREE TIMES A DAY AS NEEDED FOR MUSCLE SPASMS 30 tablet 0   fenofibrate (TRICOR) 145 MG tablet Take 1 tablet (145 mg total) by mouth daily. 90 tablet 2   gabapentin (NEURONTIN) 300 MG capsule Take 1 capsule (300 mg total) by mouth 3 (three) times daily. 90 capsule 3   gabapentin (NEURONTIN) 300 MG capsule Take 1 capsule (300 mg total) by mouth 3 (three) times daily. 90 capsule 3   hydrOXYzine (ATARAX) 25 MG tablet Take 1 tablet (25 mg total) by mouth 3 (three) times daily as needed. 75 tablet 2   nicotine (NICODERM CQ - DOSED IN MG/24 HOURS) 21 mg/24hr patch Place 1 patch (21 mg total) onto the skin daily. 28 patch 0   oxcarbazepine (TRILEPTAL) 600 MG tablet Take 1 tablet (600 mg total) by mouth 2 (two) times daily. 60 tablet 3   traZODone (DESYREL) 100 MG tablet Take 2 tablets (200 mg total) by mouth at bedtime. 60 tablet 3   valsartan (DIOVAN) 80 MG tablet Take 1 tablet (80 mg total) by mouth daily. 90 tablet 3   venlafaxine XR (EFFEXOR XR) 75 MG 24 hr capsule Take 1 capsule (75 mg total) by mouth daily. 30  capsule 3   venlafaxine XR (EFFEXOR-XR) 150 MG 24 hr capsule Take 1 capsule (150 mg total) by mouth at bedtime. 30 capsule 3   No current facility-administered medications for this visit.     Musculoskeletal: Strength & Muscle Tone: within normal limits Gait & Station: normal Patient leans: N/A  Psychiatric Specialty Exam: Review of Systems  Psychiatric/Behavioral:  Negative for decreased concentration, dysphoric mood, hallucinations, self-injury, sleep disturbance and suicidal ideas. The patient is nervous/anxious. The patient is not hyperactive.     There were no vitals taken for this visit.There is no height or weight on file to calculate BMI.  General Appearance: Casual  Eye Contact:  Good  Speech:  Clear and Coherent and Normal Rate  Volume:  Normal  Mood:  Anxious and Depressed  Affect:  Appropriate  Thought Process:  Coherent and Descriptions of Associations: Intact  Orientation:  Full (Time, Place, and Person)  Thought Content: WDL   Suicidal Thoughts:  No  Homicidal Thoughts:  No  Memory:  Immediate;   Good Recent;   Fair Remote;   Fair  Judgement:  Fair  Insight:  Good  Psychomotor Activity:  Normal  Concentration:  Concentration: Good and Attention Span: Good  Recall:  Good  Fund of Knowledge: Good  Language: Good  Akathisia:  No  Handed:  Right  AIMS (if indicated): not done  Assets:  Communication Skills Desire for Improvement Financial Resources/Insurance Housing Vocational/Educational  ADL's:  Intact  Cognition: WNL  Sleep:  Good   Screenings: AIMS    Flowsheet Row Admission (Discharged) from OP Visit from 05/24/2019 in [facility]  AIMS Total Score 0      AUDIT    Flowsheet Row Admission (Discharged) from OP Visit from 05/24/2019 in [facility]  Alcohol Use Disorder Identification Test  Final Score (AUDIT) 0      GAD-7    Flowsheet Row Video Visit from 11/08/2022 in [facility] Video Visit from 08/02/2022 in [facility] Office Visit from 05/31/2022 in [facility] Video Visit from 12/21/2021 in [facility] Office Visit from 12/14/2021 in [facility]  Total GAD-7 Score 6 7 8 5 12       PHQ2-9    Flowsheet Row Video Visit from 11/08/2022 in [facility] Video Visit from 08/02/2022 in [facility] Office Visit from 05/31/2022 in [facility] Video Visit from 12/21/2021 in [facility] Office Visit from 12/14/2021 in [facility]  PHQ-2 Total Score 1 3 2 1 1   PHQ-9 Total Score -- 10 10 -- 7      Flowsheet Row Video Visit from 11/08/2022 in [facility] Video Visit from 08/02/2022 in [facility] Video Visit from 05/31/2022 in [facility]  C-SSRS RISK CATEGORY Low Risk Low Risk Low Risk        Assessment and Plan:   Corey Smith "Corey Smith" is a 37 year old, Caucasian male with a past psychiatric history significant for bipolar 1 disorder who presents to [facility] via virtual video visit for follow up and medication management.  Patient reports no issues or concerns regarding his current medication regimen.  Patient denies experiencing any adverse side effects and further denies the need for dosage adjustments at this time.  Patient denies depression and states that his anxiety is manageable at this time.  Patient endorses stability on his current medication regimen.  Patient to continue taking medications as prescribed.  Patient's medications to be e-prescribed to pharmacy of choice.  Collaboration of Care: Collaboration of Care: Medication Management AEB provider managing  patient's psychiatric medications, Primary Care Provider AEB patient being followed by primary care provider in internal medicine, and Psychiatrist AEB patient being followed by a mental health provider  Patient/Guardian was advised Release of Information must be obtained prior to any record release in order to collaborate their care with an outside provider. Patient/Guardian was advised if they have not already done so to contact the registration department to sign all necessary forms in order for [place] to release information regarding their care.   Consent: Patient/Guardian gives verbal consent for treatment and assignment of benefits for services provided during this visit. Patient/Guardian expressed understanding and agreed to proceed.   1. Bipolar 1 disorder (HCC)  - buPROPion (WELLBUTRIN XL) 150 MG 24 hr tablet; Take 1 tablet (150 mg total) by mouth every morning.  Dispense: 30 tablet; Refill: 3 - venlafaxine XR (EFFEXOR-XR) 150 MG 24 hr capsule; Take 1 capsule (150 mg total) by mouth at bedtime.  Dispense: 30 capsule; Refill: 3 - busPIRone (BUSPAR) 15 MG tablet; Take 1 tablet (15 mg total) by mouth 3 (three) times daily.  Dispense: 90 tablet; Refill: 3 - traZODone (DESYREL) 100 MG tablet; Take 2 tablets (200 mg total) by mouth at bedtime.  Dispense: 60 tablet; Refill: 3 - hydrOXYzine (ATARAX) 25 MG tablet; Take 1 tablet (25 mg total) by mouth 3 (three) times daily as needed.  Dispense: 75 tablet; Refill: 2 - gabapentin (NEURONTIN) 300 MG capsule; Take 1 capsule (300 mg total) by mouth 3 (three) times  daily.  Dispense: 90 capsule; Refill: 3 - oxcarbazepine (TRILEPTAL) 600 MG tablet; Take 1 tablet (600 mg total) by mouth 2 (two) times daily.  Dispense: 60 tablet; Refill: 3 - venlafaxine XR (EFFEXOR XR) 75 MG 24 hr capsule; Take 1 capsule (75 mg total) by mouth daily.  Dispense: 30 capsule; Refill: 3  Patient to follow-up in 3 months Provider spent a total of 10 minutes with the patient/reviewing  patient's chart  Meta Hatchet, PA 11/08/2022, 8:06 PM

## 2022-12-11 NOTE — Progress Notes (Deleted)
New Patient Office Visit  Subjective    Patient ID: Corey Smith, male    DOB: 04/02/1986  Age: 37 y.o. MRN: [id number]  CC:  No chief complaint on file.   HPI 12/14/21 Corey Smith presents to establish care. He was last seen by a primary care provider 5 years ago. He has history of anxiety, major depressive disorder, bipolar 1 disorder, history of opioid abuse and low back pain with sciatica. 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   Patient sees [facility] regularly and they manage his mental health medications. Currently he is on: Buspirone 15 mg TID, Trileptal 600 mg BID, Trazodone 100 mg daily and Effexor 150 mg daily. Reports he is satisfied with his medication regimen and feels well controlled.  Next appointment is 12/21/2021.   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 Methadone was stopped about 4 years ago. Last heroine use about 3 years ago.   He has a long standing history of lower back pain with sciatica pains. Pain affects both legs equally. Reports a prior MRI years ago. Also states he was on gabapentin and flexeril at one time and they substantially improved his symptoms.   Reports he has gained a considerable amount of weight. Today he is at 250 lbs, no previous weight on file. However, reports he was 180 lbs about 3 years ago. He is interested in weight loss tips.   He is concerned his blood pressure is high. Today's blood pressure is 139/85. Reports when he checks it himself, the numbers are usually right around that too.  He does smoke cigarettes, about 1 pack a day.  Lastly, he reports he has an umbilical hernia that has been present for years. His father also had one. It does not cause him pain, only mild discomfort while lifting/certain positions, and reduces when he pushes on it.   He does not have insurance at this time, however he did obtain orange card application today.    05/31/22 The  patient is seen for follow-up on arrival blood pressure elevated to 138/97 and he has not been taking his blood pressure medications.  He thought they were making her drowsy.  Note he is also on atorvastatin which may have been interacting with his mental health medications causing drowsiness.  He has no other complaints Patient does see mental health been able to stop smoking.  He has lots of mental health issues which contribute to his continued smoking use.  8/6  Outpatient Encounter Medications as of 12/13/2022  Medication Sig   buPROPion (WELLBUTRIN XL) 150 MG 24 hr tablet Take 1 tablet (150 mg total) by mouth every morning.   busPIRone (BUSPAR) 15 MG tablet Take 1 tablet (15 mg total) by mouth 3 (three) times daily.   carvedilol (COREG) 6.25 MG tablet TAKE 1 TABLET BY MOUTH TWICE A DAY WITH A MEAL   cyclobenzaprine (FLEXERIL) 10 MG tablet TAKE 1 TABLET BY MOUTH THREE TIMES A DAY AS NEEDED FOR MUSCLE SPASMS   fenofibrate (TRICOR) 145 MG tablet Take 1 tablet (145 mg total) by mouth daily.   gabapentin (NEURONTIN) 300 MG capsule Take 1 capsule (300 mg total) by mouth 3 (three) times daily.   gabapentin (NEURONTIN) 300 MG capsule Take 1 capsule (300 mg total) by mouth 3 (three) times daily.   hydrOXYzine (ATARAX) 25 MG tablet Take 1 tablet (25 mg total) by mouth 3 (three) times daily as needed.  nicotine (NICODERM CQ - DOSED IN MG/24 HOURS) 21 mg/24hr patch Place 1 patch (21 mg total) onto the skin daily.   oxcarbazepine (TRILEPTAL) 600 MG tablet Take 1 tablet (600 mg total) by mouth 2 (two) times daily.   traZODone (DESYREL) 100 MG tablet Take 2 tablets (200 mg total) by mouth at bedtime.   valsartan (DIOVAN) 80 MG tablet Take 1 tablet (80 mg total) by mouth daily.   venlafaxine XR (EFFEXOR XR) 75 MG 24 hr capsule Take 1 capsule (75 mg total) by mouth daily.   venlafaxine XR (EFFEXOR-XR) 150 MG 24 hr capsule Take 1 capsule (150 mg total) by mouth at bedtime.   No facility-administered  encounter medications on file as of 12/13/2022.    Past Medical History:  Diagnosis Date   ANXIETY 06/16/2010   Bipolar 1 disorder (HCC) 2018   Bipolar disorder (HCC)    FIBROMYALGIA 06/16/2010   HYPERLIPIDEMIA 06/16/2010   HYPERTRIGLYCERIDEMIA 06/16/2010   Impaired glucose tolerance 07/14/2011   SCOLIOSIS, MILD 02/16/2007   WISDOM TEETH EXTRACTION, HX OF 02/16/2007    Past Surgical History:  Procedure Laterality Date   CORNEAL TRANSPLANT Left 2019   WISDOM TOOTH EXTRACTION Bilateral 2003    Family History  Problem Relation Age of Onset   Breast cancer Mother    Hypertension Father    High Cholesterol Father    Depression Brother    Anxiety disorder Brother    Autism spectrum disorder Son    Hypertension Other    Anxiety disorder Other     Social History   Socioeconomic History   Marital status: Divorced    Spouse name: Not on file   Number of children: Not on file   Years of education: Not on file   Highest education level: Not on file  Occupational History   Not on file  Tobacco Use   Smoking status: Every Day    Current packs/day: 1.00    Average packs/day: 1 pack/day for 21.6 years (21.6 ttl pk-yrs)    Types: Cigarettes    Start date: 2003   Smokeless tobacco: Never  Vaping Use   Vaping status: Never Used  Substance and Sexual Activity   Alcohol use: Not Currently    Comment: denies   Drug use: Not Currently    Comment: last use illicit substances 06/13/2019   Sexual activity: Yes    Partners: Female    Comment: Girlfriend is on oral contraception  Other Topics Concern   Not on file  Social History Narrative   Not on file   Social Determinants of Health   Financial Resource Strain: Not on file  Food Insecurity: Not on file  Transportation Needs: Not on file  Physical Activity: Not on file  Stress: Not on file  Social Connections: Not on file  Intimate Partner Violence: Not on file    Review of Systems  Constitutional: Negative.  Negative  for chills, diaphoresis, fever, malaise/fatigue and weight loss.       Weight gain over 3 yrs 180>>>250#  HENT: Negative.  Negative for congestion, hearing loss, nosebleeds, sore throat and tinnitus.   Eyes: Negative.  Negative for blurred vision, photophobia and redness.  Respiratory:  Negative for cough, hemoptysis, sputum production, shortness of breath, wheezing and stridor.   Cardiovascular: Negative.  Negative for chest pain, palpitations, orthopnea, claudication, leg swelling and PND.  Gastrointestinal: Negative.  Negative for abdominal pain, blood in stool, constipation, diarrhea, heartburn, nausea and vomiting.  Genitourinary:  Negative for dysuria, flank  pain, frequency, hematuria and urgency.  Musculoskeletal:  Positive for back pain. Negative for falls, joint pain, myalgias and neck pain.       Right > left sciatica  Skin: Negative.  Negative for itching and rash.  Neurological:  Negative for dizziness, tingling, tremors, sensory change, speech change, focal weakness, seizures, loss of consciousness, weakness and headaches.  Endo/Heme/Allergies:  Negative for environmental allergies and polydipsia. Does not bruise/bleed easily.  Psychiatric/Behavioral: Negative.  Negative for depression, hallucinations, memory loss, substance abuse and suicidal ideas. The patient is not nervous/anxious and does not have insomnia.         Objective    There were no vitals taken for this visit.  Physical Exam Constitutional:      Appearance: He is obese.  HENT:     Right Ear: Tympanic membrane, ear canal and external ear normal.     Left Ear: Tympanic membrane, ear canal and external ear normal.     Nose: Nose normal.     Mouth/Throat:     Mouth: Mucous membranes are moist.     Pharynx: Oropharynx is clear.  Eyes:     Conjunctiva/sclera: Conjunctivae normal.  Cardiovascular:     Rate and Rhythm: Normal rate and regular rhythm.     Pulses: Normal pulses.     Heart sounds: Normal heart  sounds.  Pulmonary:     Effort: Pulmonary effort is normal.     Breath sounds: Normal breath sounds.  Abdominal:     General: Bowel sounds are normal.     Palpations: Abdomen is soft.     Comments: Small umbilical hernia present, easily reducible   Musculoskeletal:     Cervical back: Normal range of motion.  Skin:    General: Skin is warm.     Capillary Refill: Capillary refill takes less than 2 seconds.  Neurological:     General: No focal deficit present.     Mental Status: He is alert and oriented to person, place, and time.     Sensory: No sensory deficit.     Motor: No weakness.     Gait: Gait normal.  Psychiatric:        Mood and Affect: Mood normal.        Behavior: Behavior normal.          Assessment & Plan:   Problem List Items Addressed This Visit   None  Needs short-term follow-up on hypertension No follow-ups on file.   Shan Levans, MD

## 2022-12-13 ENCOUNTER — Ambulatory Visit: Payer: Commercial Managed Care - HMO | Admitting: Critical Care Medicine

## 2022-12-16 ENCOUNTER — Ambulatory Visit: Payer: Commercial Managed Care - HMO | Attending: Critical Care Medicine | Admitting: Critical Care Medicine

## 2022-12-16 ENCOUNTER — Encounter: Payer: Self-pay | Admitting: Critical Care Medicine

## 2022-12-16 VITALS — BP 148/107 | HR 115 | Temp 98.4°F | Ht 67.0 in | Wt 236.4 lb

## 2022-12-16 DIAGNOSIS — R000 Tachycardia, unspecified: Secondary | ICD-10-CM | POA: Diagnosis not present

## 2022-12-16 DIAGNOSIS — Z720 Tobacco use: Secondary | ICD-10-CM

## 2022-12-16 DIAGNOSIS — E782 Mixed hyperlipidemia: Secondary | ICD-10-CM

## 2022-12-16 DIAGNOSIS — E781 Pure hyperglyceridemia: Secondary | ICD-10-CM | POA: Insufficient documentation

## 2022-12-16 DIAGNOSIS — Z6837 Body mass index (BMI) 37.0-37.9, adult: Secondary | ICD-10-CM

## 2022-12-16 DIAGNOSIS — Z91148 Patient's other noncompliance with medication regimen for other reason: Secondary | ICD-10-CM | POA: Insufficient documentation

## 2022-12-16 DIAGNOSIS — M5440 Lumbago with sciatica, unspecified side: Secondary | ICD-10-CM | POA: Diagnosis not present

## 2022-12-16 DIAGNOSIS — Z114 Encounter for screening for human immunodeficiency virus [HIV]: Secondary | ICD-10-CM | POA: Insufficient documentation

## 2022-12-16 DIAGNOSIS — Z1159 Encounter for screening for other viral diseases: Secondary | ICD-10-CM

## 2022-12-16 DIAGNOSIS — F419 Anxiety disorder, unspecified: Secondary | ICD-10-CM | POA: Insufficient documentation

## 2022-12-16 DIAGNOSIS — E669 Obesity, unspecified: Secondary | ICD-10-CM

## 2022-12-16 DIAGNOSIS — F17210 Nicotine dependence, cigarettes, uncomplicated: Secondary | ICD-10-CM | POA: Diagnosis not present

## 2022-12-16 DIAGNOSIS — F319 Bipolar disorder, unspecified: Secondary | ICD-10-CM | POA: Insufficient documentation

## 2022-12-16 DIAGNOSIS — I10 Essential (primary) hypertension: Secondary | ICD-10-CM | POA: Insufficient documentation

## 2022-12-16 MED ORDER — VALSARTAN-HYDROCHLOROTHIAZIDE 160-12.5 MG PO TABS: 1.0000 | ORAL_TABLET | Freq: Every day | ORAL | 3 refills | Status: DC

## 2022-12-16 MED ORDER — CYCLOBENZAPRINE HCL 10 MG PO TABS: 10.0000 mg | ORAL_TABLET | Freq: Three times a day (TID) | ORAL | 0 refills | Status: DC

## 2022-12-16 MED ORDER — GABAPENTIN 300 MG PO CAPS
300.0000 mg | ORAL_CAPSULE | Freq: Three times a day (TID) | ORAL | 3 refills | Status: DC
Start: 2022-12-16 — End: 2023-03-02

## 2022-12-16 MED ORDER — FENOFIBRATE 145 MG PO TABS: 145.0000 mg | ORAL_TABLET | Freq: Every day | ORAL | 2 refills | Status: DC

## 2022-12-16 MED ORDER — CARVEDILOL 12.5 MG PO TABS
12.5000 mg | ORAL_TABLET | Freq: Two times a day (BID) | ORAL | 2 refills | Status: DC
Start: 2022-12-16 — End: 2023-03-02

## 2022-12-16 NOTE — Assessment & Plan Note (Signed)
Nicotine lozange and nicotine patch sent to pharmacy downstairs Diet and lifestyle handout reviewed in depth with patient today     Current smoking consumption amount: 1PPD  Dicsussion on advise to quit smoking and smoking impacts: cv impact lungs  Patient's willingness to quit:  Wants to quit  Methods to quit smoking discussed:  behav mod.  Did not have success with nicotine or Wellbutrin cannot Chantix due to mental health  Medication managem take oking session drugs discussed: None available   Quit date not established Follow-up arranged 6 weeks   Time spent counseling the patient:  5 min

## 2022-12-16 NOTE — Assessment & Plan Note (Signed)
Continue current medications check lipids

## 2022-12-16 NOTE — Patient Instructions (Signed)
All medications refills sent to [facility] Increase carvedilol to 12.5 mg two times daily Increase valsartan to valsartan hct 160/12.5  Labs today  Get mychart and message me your blood pressures  RN Blood pressure visit 2 weeks RN  Return 6 weeks

## 2022-12-16 NOTE — Assessment & Plan Note (Signed)
Not at goal plan to begin valsartan HCT 160/12.5 daily and increase carvedilol 12.5 mg twice daily  Labs will be obtained  Return 2 weeks for RN recheck blood pressure  Return for MD recheck 6 weeks  Patient has a blood pressure meter he will measure his blood pressure and message [place] results on MyChart

## 2022-12-16 NOTE — Progress Notes (Signed)
New Patient Office Visit  Subjective    Patient ID: Corey Smith, male    DOB: March 06, 1986  Age: 37 y.o. MRN: [id number]  CC:  Chief Complaint  Patient presents with   Hypertension    HPI 12/14/21 Corey Smith presents to establish care. He was last seen by a primary care provider 5 years ago. He has history of anxiety, major depressive disorder, bipolar 1 disorder, history of opioid abuse and low back pain with sciatica. 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   Patient sees [facility] regularly and they manage his mental health medications. Currently he is on: Buspirone 15 mg TID, Trileptal 600 mg BID, Trazodone 100 mg daily and Effexor 150 mg daily. Reports he is satisfied with his medication regimen and feels well controlled.  Next appointment is 12/21/2021.   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 Methadone was stopped about 4 years ago. Last heroine use about 3 years ago.   He has a long standing history of lower back pain with sciatica pains. Pain affects both legs equally. Reports a prior MRI years ago. Also states he was on gabapentin and flexeril at one time and they substantially improved his symptoms.   Reports he has gained a considerable amount of weight. Today he is at 250 lbs, no previous weight on file. However, reports he was 180 lbs about 3 years ago. He is interested in weight loss tips.   He is concerned his blood pressure is high. Today's blood pressure is 139/85. Reports when he checks it himself, the numbers are usually right around that too.  He does smoke cigarettes, about 1 pack a day.  Lastly, he reports he has an umbilical hernia that has been present for years. His father also had one. It does not cause him pain, only mild discomfort while lifting/certain positions, and reduces when he pushes on it.   He does not have insurance at this time, however he did obtain orange card  application today.    05/31/22 The patient is seen for follow-up on arrival blood pressure elevated to 138/97 and he has not been taking his blood pressure medications.  He thought they were making her drowsy.  Note he is also on atorvastatin which may have been interacting with his mental health medications causing drowsiness.  He has no other complaints Patient does see mental health been able to stop smoking.  He has lots of mental health issues which contribute to his continued smoking use.  8/9 The patient was seen in return follow-up he did not get his valsartan filled blood pressure today is elevated 148/107.  He still smoking a pack a day of cigarettes.  He does need screening with HIV and hepatitis C.  He does not have a blood pressure meter at home.    Outpatient Encounter Medications as of 12/16/2022  Medication Sig   buPROPion (WELLBUTRIN XL) 150 MG 24 hr tablet Take 1 tablet (150 mg total) by mouth every morning.   busPIRone (BUSPAR) 15 MG tablet Take 1 tablet (15 mg total) by mouth 3 (three) times daily.   hydrOXYzine (ATARAX) 25 MG tablet Take 1 tablet (25 mg total) by mouth 3 (three) times daily as needed.   oxcarbazepine (TRILEPTAL) 600 MG tablet Take 1 tablet (600 mg total) by mouth 2 (two) times daily.   traZODone (DESYREL) 100 MG tablet Take 2 tablets (200 mg total) by mouth at  bedtime.   valsartan-hydrochlorothiazide (DIOVAN-HCT) 160-12.5 MG tablet Take 1 tablet by mouth daily.   venlafaxine XR (EFFEXOR XR) 75 MG 24 hr capsule Take 1 capsule (75 mg total) by mouth daily.   venlafaxine XR (EFFEXOR-XR) 150 MG 24 hr capsule Take 1 capsule (150 mg total) by mouth at bedtime.   [DISCONTINUED] carvedilol (COREG) 6.25 MG tablet TAKE 1 TABLET BY MOUTH TWICE A DAY WITH A MEAL   [DISCONTINUED] cyclobenzaprine (FLEXERIL) 10 MG tablet TAKE 1 TABLET BY MOUTH THREE TIMES A DAY AS NEEDED FOR MUSCLE SPASMS   [DISCONTINUED] fenofibrate (TRICOR) 145 MG tablet Take 1 tablet (145 mg total) by  mouth daily.   [DISCONTINUED] gabapentin (NEURONTIN) 300 MG capsule Take 1 capsule (300 mg total) by mouth 3 (three) times daily.   [DISCONTINUED] gabapentin (NEURONTIN) 300 MG capsule Take 1 capsule (300 mg total) by mouth 3 (three) times daily.   [DISCONTINUED] nicotine (NICODERM CQ - DOSED IN MG/24 HOURS) 21 mg/24hr patch Place 1 patch (21 mg total) onto the skin daily.   [DISCONTINUED] valsartan (DIOVAN) 80 MG tablet Take 1 tablet (80 mg total) by mouth daily.   carvedilol (COREG) 12.5 MG tablet Take 1 tablet (12.5 mg total) by mouth 2 (two) times daily with a meal.   cyclobenzaprine (FLEXERIL) 10 MG tablet Take 1 tablet (10 mg total) by mouth 3 (three) times daily.   fenofibrate (TRICOR) 145 MG tablet Take 1 tablet (145 mg total) by mouth daily.   gabapentin (NEURONTIN) 300 MG capsule Take 1 capsule (300 mg total) by mouth 3 (three) times daily.   No facility-administered encounter medications on file as of 12/16/2022.    Past Medical History:  Diagnosis Date   ANXIETY 06/16/2010   Bipolar 1 disorder (HCC) 2018   Bipolar disorder (HCC)    FIBROMYALGIA 06/16/2010   HYPERLIPIDEMIA 06/16/2010   HYPERTRIGLYCERIDEMIA 06/16/2010   Impaired glucose tolerance 07/14/2011   SCOLIOSIS, MILD 02/16/2007   WISDOM TEETH EXTRACTION, HX OF 02/16/2007    Past Surgical History:  Procedure Laterality Date   CORNEAL TRANSPLANT Left 2019   WISDOM TOOTH EXTRACTION Bilateral 2003    Family History  Problem Relation Age of Onset   Breast cancer Mother    Hypertension Father    High Cholesterol Father    Depression Brother    Anxiety disorder Brother    Autism spectrum disorder Son    Hypertension Other    Anxiety disorder Other     Social History   Socioeconomic History   Marital status: Divorced    Spouse name: Not on file   Number of children: Not on file   Years of education: Not on file   Highest education level: Not on file  Occupational History   Not on file  Tobacco Use    Smoking status: Every Day    Current packs/day: 1.00    Average packs/day: 1 pack/day for 21.6 years (21.6 ttl pk-yrs)    Types: Cigarettes    Start date: 2003   Smokeless tobacco: Never  Vaping Use   Vaping status: Never Used  Substance and Sexual Activity   Alcohol use: Not Currently    Comment: denies   Drug use: Not Currently    Comment: last use illicit substances 06/13/2019   Sexual activity: Yes    Partners: Female    Comment: Girlfriend is on oral contraception  Other Topics Concern   Not on file  Social History Narrative   Not on file   Social Determinants of Health  Financial Resource Strain: Not on file  Food Insecurity: Not on file  Transportation Needs: Not on file  Physical Activity: Not on file  Stress: Not on file  Social Connections: Not on file  Intimate Partner Violence: Not on file    Review of Systems  Constitutional: Negative.  Negative for chills, diaphoresis, fever, malaise/fatigue and weight loss.       Weight gain over 3 yrs 180>>>250#  HENT: Negative.  Negative for congestion, hearing loss, nosebleeds, sore throat and tinnitus.   Eyes: Negative.  Negative for blurred vision, photophobia and redness.  Respiratory:  Negative for cough, hemoptysis, sputum production, shortness of breath, wheezing and stridor.   Cardiovascular: Negative.  Negative for chest pain, palpitations, orthopnea, claudication, leg swelling and PND.  Gastrointestinal: Negative.  Negative for abdominal pain, blood in stool, constipation, diarrhea, heartburn, nausea and vomiting.  Genitourinary:  Negative for dysuria, flank pain, frequency, hematuria and urgency.  Musculoskeletal:  Positive for back pain. Negative for falls, joint pain, myalgias and neck pain.       Right > left sciatica  Skin: Negative.  Negative for itching and rash.  Neurological:  Negative for dizziness, tingling, tremors, sensory change, speech change, focal weakness, seizures, loss of consciousness,  weakness and headaches.  Endo/Heme/Allergies:  Negative for environmental allergies and polydipsia. Does not bruise/bleed easily.  Psychiatric/Behavioral: Negative.  Negative for depression, hallucinations, memory loss, substance abuse and suicidal ideas. The patient is not nervous/anxious and does not have insomnia.         Objective    BP (!) 148/107   Pulse (!) 115   Temp 98.4 F (36.9 C)   Ht 5\' 7"  (1.702 m)   Wt 236 lb 6.4 oz (107.2 kg)   SpO2 96%   BMI 37.03 kg/m   Physical Exam Constitutional:      Appearance: He is obese.  HENT:     Right Ear: Tympanic membrane, ear canal and external ear normal.     Left Ear: Tympanic membrane, ear canal and external ear normal.     Nose: Nose normal.     Mouth/Throat:     Mouth: Mucous membranes are moist.     Pharynx: Oropharynx is clear.  Eyes:     Conjunctiva/sclera: Conjunctivae normal.  Cardiovascular:     Rate and Rhythm: Normal rate and regular rhythm.     Pulses: Normal pulses.     Heart sounds: Normal heart sounds.  Pulmonary:     Effort: Pulmonary effort is normal.     Breath sounds: Normal breath sounds.  Abdominal:     General: Bowel sounds are normal.     Palpations: Abdomen is soft.     Comments: Small umbilical hernia present, easily reducible   Musculoskeletal:     Cervical back: Normal range of motion.  Skin:    General: Skin is warm.     Capillary Refill: Capillary refill takes less than 2 seconds.  Neurological:     General: No focal deficit present.     Mental Status: He is alert and oriented to person, place, and time.     Sensory: No sensory deficit.     Motor: No weakness.     Gait: Gait normal.  Psychiatric:        Mood and Affect: Mood normal.        Behavior: Behavior normal.          Assessment & Plan:   Problem List Items Addressed This Visit  Cardiovascular and Mediastinum   Primary hypertension    Not at goal plan to begin valsartan HCT 160/12.5 daily and increase  carvedilol 12.5 mg twice daily  Labs will be obtained  Return 2 weeks for RN recheck blood pressure  Return for MD recheck 6 weeks  Patient has a blood pressure meter he will measure his blood pressure and message [place] results on MyChart      Relevant Medications   fenofibrate (TRICOR) 145 MG tablet   carvedilol (COREG) 12.5 MG tablet   valsartan-hydrochlorothiazide (DIOVAN-HCT) 160-12.5 MG tablet   Other Relevant Orders   Comprehensive metabolic panel   Recheck vitals     Other   Hyperlipidemia - Primary    Continue current medications check lipids      Relevant Medications   fenofibrate (TRICOR) 145 MG tablet   carvedilol (COREG) 12.5 MG tablet   valsartan-hydrochlorothiazide (DIOVAN-HCT) 160-12.5 MG tablet   Other Relevant Orders   Lipid panel   Bipolar 1 disorder (HCC)   Relevant Medications   gabapentin (NEURONTIN) 300 MG capsule   Tobacco abuse    Nicotine lozange and nicotine patch sent to pharmacy downstairs Diet and lifestyle handout reviewed in depth with patient today     Current smoking consumption amount: 1PPD  Dicsussion on advise to quit smoking and smoking impacts: cv impact lungs  Patient's willingness to quit:  Wants to quit  Methods to quit smoking discussed:  behav mod.  Did not have success with nicotine or Wellbutrin cannot Chantix due to mental health  Medication managem take oking session drugs discussed: None available   Quit date not established Follow-up arranged 6 weeks   Time spent counseling the patient:  5 min      Other Visit Diagnoses     Tachycardia       Relevant Medications   carvedilol (COREG) 12.5 MG tablet   Encounter for screening for HIV       Relevant Orders   HIV Antibody (routine testing w rflx)   Need for hepatitis C screening test       Relevant Orders   HCV Ab w Reflex to Quant PCR     Will check HIV hep C for population health screening Needs short-term follow-up on hypertension Return in about 6  weeks (around 01/27/2023) for htn, primary care follow up.   Shan Levans, MD

## 2022-12-17 ENCOUNTER — Other Ambulatory Visit: Payer: Self-pay | Admitting: Critical Care Medicine

## 2022-12-17 MED ORDER — ROSUVASTATIN CALCIUM 20 MG PO TABS: 20.0000 mg | ORAL_TABLET | Freq: Every day | ORAL | 2 refills | Status: DC

## 2022-12-17 NOTE — Progress Notes (Signed)
Let pt know hiv and hep c neg,  kidney liver normal, cholesterol remains very high, stay on fenofibrate and will  start second cholesterol pill daily sent to pharmacy

## 2022-12-21 ENCOUNTER — Telehealth: Payer: Self-pay

## 2022-12-21 NOTE — Telephone Encounter (Signed)
-----   Message from Shan Levans sent at 12/17/2022  6:03 AM EDT ----- Let pt know hiv and hep c neg,  kidney liver normal, cholesterol remains very high, stay on fenofibrate and will  start second cholesterol pill daily sent to pharmacy

## 2022-12-21 NOTE — Telephone Encounter (Signed)
Pt was called and is aware of results, DOB was confirmed.  ?

## 2023-01-03 ENCOUNTER — Ambulatory Visit: Payer: Commercial Managed Care - HMO | Attending: Critical Care Medicine

## 2023-01-03 DIAGNOSIS — I10 Essential (primary) hypertension: Secondary | ICD-10-CM

## 2023-01-03 NOTE — Progress Notes (Signed)
   Blood Pressure Recheck Visit  Name: Corey Smith MRN: [id number] Date of Birth: Nov 14, 1985  Corey Smith presents today for Blood Pressure recheck with clinical support staff.  Order for BP recheck by Elsie Lincoln, RN  ordered on 01/03/2023.   BP Readings from Last 3 Encounters:  01/03/23 138/84  12/16/22 (!) 148/107  05/31/22 (!) 138/97    Current Outpatient Medications  Medication Sig Dispense Refill   buPROPion (WELLBUTRIN XL) 150 MG 24 hr tablet Take 1 tablet (150 mg total) by mouth every morning. 30 tablet 3   busPIRone (BUSPAR) 15 MG tablet Take 1 tablet (15 mg total) by mouth 3 (three) times daily. 90 tablet 3   carvedilol (COREG) 12.5 MG tablet Take 1 tablet (12.5 mg total) by mouth 2 (two) times daily with a meal. 120 tablet 2   cyclobenzaprine (FLEXERIL) 10 MG tablet Take 1 tablet (10 mg total) by mouth 3 (three) times daily. 30 tablet 0   fenofibrate (TRICOR) 145 MG tablet Take 1 tablet (145 mg total) by mouth daily. 30 tablet 2   gabapentin (NEURONTIN) 300 MG capsule Take 1 capsule (300 mg total) by mouth 3 (three) times daily. 90 capsule 3   hydrOXYzine (ATARAX) 25 MG tablet Take 1 tablet (25 mg total) by mouth 3 (three) times daily as needed. 75 tablet 2   oxcarbazepine (TRILEPTAL) 600 MG tablet Take 1 tablet (600 mg total) by mouth 2 (two) times daily. 60 tablet 3   rosuvastatin (CRESTOR) 20 MG tablet Take 1 tablet (20 mg total) by mouth daily. 60 tablet 2   traZODone (DESYREL) 100 MG tablet Take 2 tablets (200 mg total) by mouth at bedtime. 60 tablet 3   valsartan-hydrochlorothiazide (DIOVAN-HCT) 160-12.5 MG tablet Take 1 tablet by mouth daily. 90 tablet 3   venlafaxine XR (EFFEXOR XR) 75 MG 24 hr capsule Take 1 capsule (75 mg total) by mouth daily. 30 capsule 3   venlafaxine XR (EFFEXOR-XR) 150 MG 24 hr capsule Take 1 capsule (150 mg total) by mouth at bedtime. 30 capsule 3   No current facility-administered medications for this visit.    Hypertensive  Medication Review: Patient states that they are taking all their hypertensive medications as prescribed and their last dose of hypertensive medications was  Last Night.     Documentation of any medication adherence discrepancies: none  Provider Recommendation:  Spoke to Dr. Delford Field and they stated: Call if bp >140/90 . Patient Blood pressure today 138/84. Continue medications as ordered.  Patient has been scheduled to follow up with Dr.  Delford Field  Patient has been given provider's recommendations and does not have any questions or concerns at this time. Patient will contact the office for any future questions or concerns.

## 2023-01-30 NOTE — Progress Notes (Deleted)
New Patient Office Visit  Subjective    Patient ID: Corey Smith, male    DOB: Dec 20, 1985  Age: 37 y.o. MRN: [id number]  CC:  No chief complaint on file.   HPI 12/14/21 [name] presents to establish care. He was last seen by a primary care provider 5 years ago. He has history of anxiety, major depressive disorder, bipolar 1 disorder, history of opioid abuse and low back pain with sciatica. 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   Patient sees [facility] regularly and they manage his mental health medications. Currently he is on: Buspirone 15 mg TID, Trileptal 600 mg BID, Trazodone 100 mg daily and Effexor 150 mg daily. Reports he is satisfied with his medication regimen and feels well controlled.  Next appointment is 12/21/2021.   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 Methadone was stopped about 4 years ago. Last heroine use about 3 years ago.   He has a long standing history of lower back pain with sciatica pains. Pain affects both legs equally. Reports a prior MRI years ago. Also states he was on gabapentin and flexeril at one time and they substantially improved his symptoms.   Reports he has gained a considerable amount of weight. Today he is at 250 lbs, no previous weight on file. However, reports he was 180 lbs about 3 years ago. He is interested in weight loss tips.   He is concerned his blood pressure is high. Today's blood pressure is 139/85. Reports when he checks it himself, the numbers are usually right around that too.  He does smoke cigarettes, about 1 pack a day.  Lastly, he reports he has an umbilical hernia that has been present for years. His father also had one. It does not cause him pain, only mild discomfort while lifting/certain positions, and reduces when he pushes on it.   He does not have insurance at this time, however he did obtain orange card application today.    05/31/22 The  patient is seen for follow-up on arrival blood pressure elevated to 138/97 and he has not been taking his blood pressure medications.  He thought they were making her drowsy.  Note he is also on atorvastatin which may have been interacting with his mental health medications causing drowsiness.  He has no other complaints Patient does see mental health been able to stop smoking.  He has lots of mental health issues which contribute to his continued smoking use.  8/9 The patient was seen in return follow-up he did not get his valsartan filled blood pressure today is elevated 148/107.  He still smoking a pack a day of cigarettes.  He does need screening with HIV and hepatitis C.  He does not have a blood pressure meter at home.  01/31/23   Outpatient Encounter Medications as of 01/31/2023  Medication Sig   buPROPion (WELLBUTRIN XL) 150 MG 24 hr tablet Take 1 tablet (150 mg total) by mouth every morning.   busPIRone (BUSPAR) 15 MG tablet Take 1 tablet (15 mg total) by mouth 3 (three) times daily.   carvedilol (COREG) 12.5 MG tablet Take 1 tablet (12.5 mg total) by mouth 2 (two) times daily with a meal.   cyclobenzaprine (FLEXERIL) 10 MG tablet Take 1 tablet (10 mg total) by mouth 3 (three) times daily.   fenofibrate (TRICOR) 145 MG tablet Take 1 tablet (145 mg total) by mouth daily.   gabapentin (  NEURONTIN) 300 MG capsule Take 1 capsule (300 mg total) by mouth 3 (three) times daily.   hydrOXYzine (ATARAX) 25 MG tablet Take 1 tablet (25 mg total) by mouth 3 (three) times daily as needed.   oxcarbazepine (TRILEPTAL) 600 MG tablet Take 1 tablet (600 mg total) by mouth 2 (two) times daily.   rosuvastatin (CRESTOR) 20 MG tablet Take 1 tablet (20 mg total) by mouth daily.   traZODone (DESYREL) 100 MG tablet Take 2 tablets (200 mg total) by mouth at bedtime.   valsartan-hydrochlorothiazide (DIOVAN-HCT) 160-12.5 MG tablet Take 1 tablet by mouth daily.   venlafaxine XR (EFFEXOR XR) 75 MG 24 hr capsule Take 1  capsule (75 mg total) by mouth daily.   venlafaxine XR (EFFEXOR-XR) 150 MG 24 hr capsule Take 1 capsule (150 mg total) by mouth at bedtime.   No facility-administered encounter medications on file as of 01/31/2023.    Past Medical History:  Diagnosis Date   ANXIETY 06/16/2010   Bipolar 1 disorder (HCC) 2018   Bipolar disorder (HCC)    FIBROMYALGIA 06/16/2010   HYPERLIPIDEMIA 06/16/2010   HYPERTRIGLYCERIDEMIA 06/16/2010   Impaired glucose tolerance 07/14/2011   SCOLIOSIS, MILD 02/16/2007   WISDOM TEETH EXTRACTION, HX OF 02/16/2007    Past Surgical History:  Procedure Laterality Date   CORNEAL TRANSPLANT Left 2019   WISDOM TOOTH EXTRACTION Bilateral 2003    Family History  Problem Relation Age of Onset   Breast cancer Mother    Hypertension Father    High Cholesterol Father    Depression Brother    Anxiety disorder Brother    Autism spectrum disorder Son    Hypertension Other    Anxiety disorder Other     Social History   Socioeconomic History   Marital status: Divorced    Spouse name: Not on file   Number of children: Not on file   Years of education: Not on file   Highest education level: Not on file  Occupational History   Not on file  Tobacco Use   Smoking status: Every Day    Current packs/day: 1.00    Average packs/day: 1 pack/day for 21.7 years (21.7 ttl pk-yrs)    Types: Cigarettes    Start date: 2003   Smokeless tobacco: Never  Vaping Use   Vaping status: Never Used  Substance and Sexual Activity   Alcohol use: Not Currently    Comment: denies   Drug use: Not Currently    Comment: last use illicit substances 06/13/2019   Sexual activity: Yes    Partners: Female    Comment: Girlfriend is on oral contraception  Other Topics Concern   Not on file  Social History Narrative   Not on file   Social Determinants of Health   Financial Resource Strain: Not on file  Food Insecurity: Not on file  Transportation Needs: Not on file  Physical Activity:  Not on file  Stress: Not on file  Social Connections: Not on file  Intimate Partner Violence: Not on file    Review of Systems  Constitutional: Negative.  Negative for chills, diaphoresis, fever, malaise/fatigue and weight loss.       Weight gain over 3 yrs 180>>>250#  HENT: Negative.  Negative for congestion, hearing loss, nosebleeds, sore throat and tinnitus.   Eyes: Negative.  Negative for blurred vision, photophobia and redness.  Respiratory:  Negative for cough, hemoptysis, sputum production, shortness of breath, wheezing and stridor.   Cardiovascular: Negative.  Negative for chest pain, palpitations, orthopnea,  claudication, leg swelling and PND.  Gastrointestinal: Negative.  Negative for abdominal pain, blood in stool, constipation, diarrhea, heartburn, nausea and vomiting.  Genitourinary:  Negative for dysuria, flank pain, frequency, hematuria and urgency.  Musculoskeletal:  Positive for back pain. Negative for falls, joint pain, myalgias and neck pain.       Right > left sciatica  Skin: Negative.  Negative for itching and rash.  Neurological:  Negative for dizziness, tingling, tremors, sensory change, speech change, focal weakness, seizures, loss of consciousness, weakness and headaches.  Endo/Heme/Allergies:  Negative for environmental allergies and polydipsia. Does not bruise/bleed easily.  Psychiatric/Behavioral: Negative.  Negative for depression, hallucinations, memory loss, substance abuse and suicidal ideas. The patient is not nervous/anxious and does not have insomnia.         Objective    There were no vitals taken for this visit.  Physical Exam Constitutional:      Appearance: He is obese.  HENT:     Right Ear: Tympanic membrane, ear canal and external ear normal.     Left Ear: Tympanic membrane, ear canal and external ear normal.     Nose: Nose normal.     Mouth/Throat:     Mouth: Mucous membranes are moist.     Pharynx: Oropharynx is clear.  Eyes:      Conjunctiva/sclera: Conjunctivae normal.  Cardiovascular:     Rate and Rhythm: Normal rate and regular rhythm.     Pulses: Normal pulses.     Heart sounds: Normal heart sounds.  Pulmonary:     Effort: Pulmonary effort is normal.     Breath sounds: Normal breath sounds.  Abdominal:     General: Bowel sounds are normal.     Palpations: Abdomen is soft.     Comments: Small umbilical hernia present, easily reducible   Musculoskeletal:     Cervical back: Normal range of motion.  Skin:    General: Skin is warm.     Capillary Refill: Capillary refill takes less than 2 seconds.  Neurological:     General: No focal deficit present.     Mental Status: He is alert and oriented to person, place, and time.     Sensory: No sensory deficit.     Motor: No weakness.     Gait: Gait normal.  Psychiatric:        Mood and Affect: Mood normal.        Behavior: Behavior normal.          Assessment & Plan:   Problem List Items Addressed This Visit   None  Will check HIV hep C for population health screening Needs short-term follow-up on hypertension No follow-ups on file.   Shan Levans, MD

## 2023-01-31 ENCOUNTER — Ambulatory Visit: Payer: Self-pay | Admitting: Critical Care Medicine

## 2023-02-07 ENCOUNTER — Encounter (HOSPITAL_COMMUNITY): Payer: Self-pay

## 2023-02-07 ENCOUNTER — Telehealth (HOSPITAL_COMMUNITY): Payer: 59 | Admitting: Physician Assistant

## 2023-02-07 ENCOUNTER — Encounter (HOSPITAL_COMMUNITY): Payer: Self-pay | Admitting: Physician Assistant

## 2023-02-07 DIAGNOSIS — F319 Bipolar disorder, unspecified: Secondary | ICD-10-CM

## 2023-02-07 MED ORDER — BUPROPION HCL ER (XL) 150 MG PO TB24
150.0000 mg | ORAL_TABLET | ORAL | 3 refills | Status: DC
Start: 2023-02-07 — End: 2023-08-10

## 2023-02-07 MED ORDER — VENLAFAXINE HCL ER 75 MG PO CP24
75.0000 mg | ORAL_CAPSULE | Freq: Every day | ORAL | 3 refills | Status: DC
Start: 2023-02-07 — End: 2023-08-02

## 2023-02-07 MED ORDER — VENLAFAXINE HCL ER 150 MG PO CP24
150.0000 mg | ORAL_CAPSULE | Freq: Every evening | ORAL | 3 refills | Status: DC
Start: 2023-02-07 — End: 2023-08-02

## 2023-02-07 MED ORDER — BUSPIRONE HCL 15 MG PO TABS: 15.0000 mg | ORAL_TABLET | Freq: Three times a day (TID) | ORAL | 3 refills | Status: AC

## 2023-02-07 MED ORDER — HYDROXYZINE HCL 25 MG PO TABS
25.0000 mg | ORAL_TABLET | Freq: Three times a day (TID) | ORAL | 2 refills | Status: DC | PRN
Start: 2023-02-07 — End: 2023-08-10

## 2023-02-07 MED ORDER — OXCARBAZEPINE 600 MG PO TABS
600.0000 mg | ORAL_TABLET | Freq: Two times a day (BID) | ORAL | 3 refills | Status: DC
Start: 2023-02-07 — End: 2023-08-10

## 2023-02-07 MED ORDER — TRAZODONE HCL 100 MG PO TABS
200.0000 mg | ORAL_TABLET | Freq: Every evening | ORAL | 3 refills | Status: DC
Start: 2023-02-07 — End: 2023-08-10

## 2023-02-07 NOTE — Progress Notes (Signed)
BH MD/PA/NP OP Progress Note  Virtual Visit via Video Note  I connected with Corey Smith on 02/07/23 at  4:00 PM EDT by a video enabled telemedicine application and verified that I am speaking with the correct person using two identifiers.  Location: Patient: Home Provider: Clinic   I discussed the limitations of evaluation and management by telemedicine and the availability of in person appointments. The patient expressed understanding and agreed to proceed.  Follow Up Instructions:   I discussed the assessment and treatment plan with the patient. The patient was provided an opportunity to ask questions and all were answered. The patient agreed with the plan and demonstrated an understanding of the instructions.   The patient was advised to call back or seek an in-person evaluation if the symptoms worsen or if the condition fails to improve as anticipated.  I provided 7 minutes of non-face-to-face time during this encounter.  Meta Hatchet, PA    02/07/2023 9:31 PM Corey Smith  MRN:  [id number]  Chief Complaint:  Chief Complaint  Patient presents with   Follow-up   Medication Refill   HPI:   Corey Smith "Corey Smith" is a 37 year old, Caucasian male with a past psychiatric history significant for bipolar 1 disorder who presents to [facility] via virtual video visit for follow up and medication management.  Patient is currently being prescribed the following psychiatric medications:  Trileptal 600 mg 2 times daily Effexor XR 75 mg in the morning and 150 mg at bedtime Bupropion (Wellbutrin XL) 150 mg 24-hour tablet daily Buspirone 15 mg 3 times daily Trazodone 200 mg at bedtime Hydroxyzine 25 mg 3 times daily as needed Gabapentin 300 mg 3 times daily  Patient presents today encounter reporting no issues or concerns regarding his current medication regimen.  Patient denies experiencing any adverse side effects at this time.  He  endorses minimal depression and states that his anxiety has not been impactful as of late.  Patient denies any new stressors at this time.  A PHQ-9 screen was performed with the patient scoring a 5.  A GAD-7 screen was also performed with the patient scoring a 5.  Patient is alert and oriented x 4, calm, cooperative, and fully engaged in conversation during the encounter.  Patient endorses good mood.  Patient denies suicidal or homicidal ideations.  He further denies auditory or visual hallucinations and does not appear to be responding to internal/external stimuli.  Patient endorses good sleep and receives on average 8 hours of sleep per night.  Patient endorses good appetite and eats on average 3 meals per day.  Patient denies alcohol consumption or illicit drug use.  Patient endorses tobacco use and smokes on average a pack per day.  Visit Diagnosis:    ICD-10-CM   1. Bipolar 1 disorder (HCC)  F31.9 venlafaxine XR (EFFEXOR XR) 75 MG 24 hr capsule    oxcarbazepine (TRILEPTAL) 600 MG tablet    hydrOXYzine (ATARAX) 25 MG tablet    traZODone (DESYREL) 100 MG tablet    venlafaxine XR (EFFEXOR-XR) 150 MG 24 hr capsule    buPROPion (WELLBUTRIN XL) 150 MG 24 hr tablet    busPIRone (BUSPAR) 15 MG tablet       Past Psychiatric History:  Diagnoses: bipolar 1 disorder, polysubstance use (opioids/heroin, cocaine, ecstasy, cannabis) in remission Medication trials: [place] Hospitalizations: yes Substance use:              -- Tobacco: 1 ppd             --  Etoh: denies             -- Denies recent illicit drug use                                    -- Last used heroin/fentanyl, cocaine in 2021  Past Medical History:  Past Medical History:  Diagnosis Date   ANXIETY 06/16/2010   Bipolar 1 disorder (HCC) 2018   Bipolar disorder (HCC)    FIBROMYALGIA 06/16/2010   HYPERLIPIDEMIA 06/16/2010   HYPERTRIGLYCERIDEMIA 06/16/2010   Impaired glucose tolerance 07/14/2011   SCOLIOSIS, MILD 02/16/2007    WISDOM TEETH EXTRACTION, HX OF 02/16/2007    Past Surgical History:  Procedure Laterality Date   CORNEAL TRANSPLANT Left 2019   WISDOM TOOTH EXTRACTION Bilateral 2003    Family Psychiatric History:  Mother - anxiety  Family History:  Family History  Problem Relation Age of Onset   Breast cancer Mother    Hypertension Father    High Cholesterol Father    Depression Brother    Anxiety disorder Brother    Autism spectrum disorder Son    Hypertension Other    Anxiety disorder Other     Social History:  Social History   Socioeconomic History   Marital status: Divorced    Spouse name: Not on file   Number of children: Not on file   Years of education: Not on file   Highest education level: Not on file  Occupational History   Not on file  Tobacco Use   Smoking status: Every Day    Current packs/day: 1.00    Average packs/day: 1 pack/day for 21.7 years (21.7 ttl pk-yrs)    Types: Cigarettes    Start date: 2003   Smokeless tobacco: Never  Vaping Use   Vaping status: Never Used  Substance and Sexual Activity   Alcohol use: Not Currently    Comment: denies   Drug use: Not Currently    Comment: last use illicit substances 06/13/2019   Sexual activity: Yes    Partners: Female    Comment: Girlfriend is on oral contraception  Other Topics Concern   Not on file  Social History Narrative   Not on file   Social Determinants of Health   Financial Resource Strain: Not on file  Food Insecurity: Not on file  Transportation Needs: Not on file  Physical Activity: Not on file  Stress: Not on file  Social Connections: Not on file    Allergies: No Known Allergies  Metabolic Disorder Labs: Lab Results  Component Value Date   HGBA1C 5.5 12/14/2021   No results found for: "PROLACTIN" Lab Results  Component Value Date   CHOL 271 (H) 12/16/2022   TRIG 354 (H) 12/16/2022   HDL 36 (L) 12/16/2022   CHOLHDL 7.5 (H) 12/16/2022   VLDL 26.2 07/11/2012   LDLCALC 167 (H)  12/16/2022   LDLCALC 160 (H) 12/14/2021   Lab Results  Component Value Date   TSH 0.389 (L) 12/14/2021   TSH 0.549 05/25/2019    Therapeutic Level Labs: No results found for: "LITHIUM" No results found for: "VALPROATE" No results found for: "CBMZ"  Current Medications: Current Outpatient Medications  Medication Sig Dispense Refill   buPROPion (WELLBUTRIN XL) 150 MG 24 hr tablet Take 1 tablet (150 mg total) by mouth every morning. 30 tablet 3   busPIRone (BUSPAR) 15 MG tablet Take 1 tablet (15 mg total) by mouth  3 (three) times daily. 90 tablet 3   carvedilol (COREG) 12.5 MG tablet Take 1 tablet (12.5 mg total) by mouth 2 (two) times daily with a meal. 120 tablet 2   cyclobenzaprine (FLEXERIL) 10 MG tablet Take 1 tablet (10 mg total) by mouth 3 (three) times daily. 30 tablet 0   fenofibrate (TRICOR) 145 MG tablet Take 1 tablet (145 mg total) by mouth daily. 30 tablet 2   gabapentin (NEURONTIN) 300 MG capsule Take 1 capsule (300 mg total) by mouth 3 (three) times daily. 90 capsule 3   hydrOXYzine (ATARAX) 25 MG tablet Take 1 tablet (25 mg total) by mouth 3 (three) times daily as needed. 75 tablet 2   oxcarbazepine (TRILEPTAL) 600 MG tablet Take 1 tablet (600 mg total) by mouth 2 (two) times daily. 60 tablet 3   rosuvastatin (CRESTOR) 20 MG tablet Take 1 tablet (20 mg total) by mouth daily. 60 tablet 2   traZODone (DESYREL) 100 MG tablet Take 2 tablets (200 mg total) by mouth at bedtime. 60 tablet 3   valsartan-hydrochlorothiazide (DIOVAN-HCT) 160-12.5 MG tablet Take 1 tablet by mouth daily. 90 tablet 3   venlafaxine XR (EFFEXOR XR) 75 MG 24 hr capsule Take 1 capsule (75 mg total) by mouth daily. 30 capsule 3   venlafaxine XR (EFFEXOR-XR) 150 MG 24 hr capsule Take 1 capsule (150 mg total) by mouth at bedtime. 30 capsule 3   No current facility-administered medications for this visit.     Musculoskeletal: Strength & Muscle Tone: within normal limits Gait & Station: normal Patient  leans: N/A  Psychiatric Specialty Exam: Review of Systems  Psychiatric/Behavioral:  Negative for decreased concentration, dysphoric mood, hallucinations, self-injury, sleep disturbance and suicidal ideas. The patient is nervous/anxious. The patient is not hyperactive.     There were no vitals taken for this visit.There is no height or weight on file to calculate BMI.  General Appearance: Casual  Eye Contact:  Good  Speech:  Clear and Coherent and Normal Rate  Volume:  Normal  Mood:  Anxious and Depressed  Affect:  Appropriate  Thought Process:  Coherent and Descriptions of Associations: Intact  Orientation:  Full (Time, Place, and Person)  Thought Content: WDL   Suicidal Thoughts:  No  Homicidal Thoughts:  No  Memory:  Immediate;   Good Recent;   Fair Remote;   Fair  Judgement:  Fair  Insight:  Good  Psychomotor Activity:  Normal  Concentration:  Concentration: Good and Attention Span: Good  Recall:  Good  Fund of Knowledge: Good  Language: Good  Akathisia:  No  Handed:  Right  AIMS (if indicated): not done  Assets:  Communication Skills Desire for Improvement Financial Resources/Insurance Housing Vocational/Educational  ADL's:  Intact  Cognition: WNL  Sleep:  Good   Screenings: AIMS    Flowsheet Row Admission (Discharged) from OP Visit from 05/24/2019 in [facility]  AIMS Total Score 0      AUDIT    Flowsheet Row Admission (Discharged) from OP Visit from 05/24/2019 in [facility]  Alcohol Use Disorder Identification Test Final Score (AUDIT) 0      GAD-7    Flowsheet Row Video Visit from 02/07/2023 in [facility] Office Visit from 12/16/2022 in [facility] Video Visit from 11/08/2022 in [facility] Video Visit from 08/02/2022 in [facility] Office Visit from 05/31/2022 in [facility]  Total GAD-7 Score 5 0 6 7 8       PHQ2-9    Flowsheet Row Video Visit from 02/07/2023 in [facility] Office Visit from 12/16/2022 in [facility] Video Visit from 11/08/2022 in [facility] Video Visit from 08/02/2022 in [facility] Office Visit from 05/31/2022 in [facility]  PHQ-2 Total Score 2 0 1 3 2   PHQ-9 Total Score 5 -- -- 10 10      Flowsheet Row Video Visit from 02/07/2023 in [facility] Video Visit from 11/08/2022 in [facility] Video Visit from 08/02/2022 in [facility]  C-SSRS RISK CATEGORY Low Risk Low Risk Low Risk        Assessment and Plan:   Corey Smith "Corey Smith" is a 37 year old, Caucasian male with a past psychiatric history significant for bipolar 1 disorder who presents to [facility] via virtual video visit for follow up and medication management.  Patient reports no issues or concerns regarding his current medication regimen.  Patient endorses minimal depression and states that his anxiety has not been as bad.  Patient denies the need for dosage adjustments at this time and endorses stability on his current medication regimen.  Patient's medications to be e-prescribed to pharmacy of choice.  Collaboration of Care: Collaboration of Care: Medication Management AEB provider managing patient's psychiatric medications, Primary Care Provider AEB patient being followed by primary care provider in internal medicine, and Psychiatrist AEB patient being followed by a mental health provider  Patient/Guardian was advised Release of Information must be obtained prior to any record release in order to collaborate their care with an outside provider. Patient/Guardian was advised if they have not  already done so to contact the registration department to sign all necessary forms in order for [place] to release information regarding their care.   Consent: Patient/Guardian gives verbal consent for treatment and assignment of benefits for services provided during this visit. Patient/Guardian expressed understanding and agreed to proceed.   1. Bipolar 1 disorder (HCC)  - venlafaxine XR (EFFEXOR XR) 75 MG 24 hr capsule; Take 1 capsule (75 mg total) by mouth daily.  Dispense: 30 capsule; Refill: 3 - oxcarbazepine (TRILEPTAL) 600 MG tablet; Take 1 tablet (600 mg total) by mouth 2 (two) times daily.  Dispense: 60 tablet; Refill: 3 - hydrOXYzine (ATARAX) 25 MG tablet; Take 1 tablet (25 mg total) by mouth 3 (three) times daily as needed.  Dispense: 75 tablet; Refill: 2 - traZODone (DESYREL) 100 MG tablet; Take 2 tablets (200 mg total) by mouth at bedtime.  Dispense: 60 tablet; Refill: 3 - venlafaxine XR (EFFEXOR-XR) 150 MG 24 hr capsule; Take 1 capsule (150 mg total) by mouth at bedtime.  Dispense: 30 capsule; Refill: 3 - buPROPion (WELLBUTRIN XL) 150 MG 24 hr tablet; Take 1 tablet (150 mg total) by mouth every morning.  Dispense: 30 tablet; Refill: 3 - busPIRone (BUSPAR) 15 MG tablet; Take 1 tablet (15 mg total) by mouth 3 (three) times daily.  Dispense: 90 tablet; Refill: 3  Patient to follow-up in 3 months Provider spent a total of 7 minutes with the patient/reviewing patient's chart  Meta Hatchet, PA 02/07/2023, 9:31 PM

## 2023-03-02 ENCOUNTER — Telehealth (HOSPITAL_BASED_OUTPATIENT_CLINIC_OR_DEPARTMENT_OTHER): Payer: Managed Care, Other (non HMO) | Admitting: Physician Assistant

## 2023-03-02 DIAGNOSIS — R000 Tachycardia, unspecified: Secondary | ICD-10-CM

## 2023-03-02 DIAGNOSIS — F319 Bipolar disorder, unspecified: Secondary | ICD-10-CM | POA: Diagnosis not present

## 2023-03-02 DIAGNOSIS — R6882 Decreased libido: Secondary | ICD-10-CM

## 2023-03-02 DIAGNOSIS — E782 Mixed hyperlipidemia: Secondary | ICD-10-CM

## 2023-03-02 DIAGNOSIS — I10 Essential (primary) hypertension: Secondary | ICD-10-CM

## 2023-03-02 MED ORDER — FENOFIBRATE 145 MG PO TABS: 145.0000 mg | ORAL_TABLET | Freq: Every day | ORAL | 3 refills | Status: DC

## 2023-03-02 MED ORDER — GABAPENTIN 300 MG PO CAPS
300.0000 mg | ORAL_CAPSULE | Freq: Three times a day (TID) | ORAL | 3 refills | Status: DC
Start: 2023-03-02 — End: 2023-08-10

## 2023-03-02 MED ORDER — ROSUVASTATIN CALCIUM 20 MG PO TABS: 20.0000 mg | ORAL_TABLET | Freq: Every day | ORAL | 3 refills | Status: DC

## 2023-03-02 MED ORDER — CARVEDILOL 12.5 MG PO TABS: 12.5000 mg | ORAL_TABLET | Freq: Two times a day (BID) | ORAL | 3 refills | Status: DC

## 2023-03-02 MED ORDER — VALSARTAN-HYDROCHLOROTHIAZIDE 160-12.5 MG PO TABS
1.0000 | ORAL_TABLET | Freq: Every day | ORAL | 3 refills | Status: AC
Start: 2023-03-02 — End: ?

## 2023-03-02 NOTE — Progress Notes (Signed)
Patient ID: Corey Smith, male   DOB: [birth year], 37 y.o.   MRN: [id number] Virtual Visit via Video Note  I connected with [name] on 03/02/23 at  3:50 PM EDT by a video enabled telemedicine application and verified that I am speaking with the correct person using two identifiers.  Location: Patient: home Provider: [facility] office   I discussed the limitations of evaluation and management by telemedicine and the availability of in person appointments. The patient expressed understanding and agreed to proceed.  History of Present Illness:  Wes is needing RF of his meds.  BP OO is usu ~130/0s.  Denies HA/CP/SOB.  He has regular f/up with [facility] for psych meds and counseling.  He is c/o low sex drive.  He denies erectile issues.  No other issues or concerns today    Observations/Objective:  NAD.  Normal speech.  Mood is stable.     Assessment and Plan: 1. Decreased libido - Testosterone,Free and Total; Future-check fasting.  Denies ED at this time  2. Bipolar 1 disorder (HCC) Stable and followed with [facility] - gabapentin (NEURONTIN) 300 MG capsule; Take 1 capsule (300 mg total) by mouth 3 (three) times daily.  Dispense: 90 capsule; Refill: 3  3. Primary hypertension - valsartan-hydrochlorothiazide (DIOVAN-HCT) 160-12.5 MG tablet; Take 1 tablet by mouth daily.  Dispense: 30 tablet; Refill: 3 - carvedilol (COREG) 12.5 MG tablet; Take 1 tablet (12.5 mg total) by mouth 2 (two) times daily with a meal.  Dispense: 60 tablet; Refill: 3  4. Tachycardia - carvedilol (COREG) 12.5 MG tablet; Take 1 tablet (12.5 mg total) by mouth 2 (two) times daily with a meal.  Dispense: 60 tablet; Refill: 3  5. Mixed hyperlipidemia - fenofibrate (TRICOR) 145 MG tablet; Take 1 tablet (145 mg total) by mouth daily.  Dispense: 30 tablet; Refill: 3 - rosuvastatin (CRESTOR) 20 MG tablet; Take 1 tablet (20 mg total) by mouth daily.  Dispense: 60 tablet; Refill: 3  Reviewed labs from 12/2022   Follow Up  Instructions: PCP for chronic conditions-please assign him a new PCP  in 3 months   I discussed the assessment and treatment plan with the patient. The patient was provided an opportunity to ask questions and all were answered. The patient agreed with the plan and demonstrated an understanding of the instructions.   The patient was advised to call back or seek an in-person evaluation if the symptoms worsen or if the condition fails to improve as anticipated.  I provided 12 minutes of non-face-to-face time during this encounter.   Georgian Co, PA-C

## 2023-03-03 ENCOUNTER — Ambulatory Visit: Payer: Managed Care, Other (non HMO) | Attending: Nurse Practitioner

## 2023-03-03 DIAGNOSIS — R6882 Decreased libido: Secondary | ICD-10-CM

## 2023-03-08 LAB — TESTOSTERONE,FREE AND TOTAL
Testosterone, Free: 18.2 pg/mL (ref 8.7–25.1)
Testosterone: 486 ng/dL (ref 264–916)

## 2023-03-10 ENCOUNTER — Telehealth: Payer: Self-pay

## 2023-03-10 NOTE — Telephone Encounter (Signed)
Patient viewed results and Doctor comment through [facility]

## 2023-03-10 NOTE — Telephone Encounter (Signed)
-----   Message from Georgian Co sent at 03/08/2023  1:23 PM EDT ----- Your testosterone levels are normal.  You do not need testosterone replacement.  Your decreased sex drive may be related some of the medications your mental health provider has you on.  Ask them about the low sex drive and see if they have any recommendations.  Thanks, Georgian Co, PA-C

## 2023-04-12 ENCOUNTER — Encounter: Payer: Self-pay | Admitting: Physician Assistant

## 2023-04-12 ENCOUNTER — Ambulatory Visit: Payer: Commercial Managed Care - HMO | Admitting: Physician Assistant

## 2023-04-12 ENCOUNTER — Ambulatory Visit: Payer: Self-pay | Admitting: *Deleted

## 2023-04-12 VITALS — BP 121/77 | HR 103

## 2023-04-12 DIAGNOSIS — M79661 Pain in right lower leg: Secondary | ICD-10-CM | POA: Diagnosis not present

## 2023-04-12 NOTE — Telephone Encounter (Signed)
  Chief Complaint: moderate severe pain left calf after playing basketball and felt pop Symptoms: difficulty bearing weight left leg. "May be some swelling" . Unable to go to work or normal activities.Marland Kitchen difficulty walking Frequency: last night  Pertinent Negatives: Patient denies chest pain no difficulty breathing no redness to calf area Disposition: [] ED /[] Urgent Care (no appt availability in office) / [] Appointment(In office/virtual)/ []  [place] [facility]/ [] Home Care/ [] Refused Recommended Disposition /[x] [place] [facility]/ []  Follow-up with PCP Additional Notes:   No available appt recommended mobile unit. If sx worsen go to ED.       Reason for Disposition  [1] SEVERE pain (e.g., excruciating, unable to do any normal activities) AND [2] not improved after 2 hours of pain medicine  Answer Assessment - Initial Assessment Questions 1. ONSET: "When did the pain start?"      Last night  2. LOCATION: "Where is the pain located?"      Left calf area  3. PAIN: "How bad is the pain?"    (Scale 1-10; or mild, moderate, severe)   -  MILD (1-3): doesn't interfere with normal activities    -  MODERATE (4-7): interferes with normal activities (e.g., work or school) or awakens from sleep, limping    -  SEVERE (8-10): excruciating pain, unable to do any normal activities, unable to walk     Stand and bear weight 7-8/10 pain level. Unable to do normal activities 4. WORK OR EXERCISE: "Has there been any recent work or exercise that involved this part of the body?"      Playing basketball with son and felt "pop" in back of leg 5. CAUSE: "What do you think is causing the leg pain?"     Muscle pull 6. OTHER SYMPTOMS: "Do you have any other symptoms?" (e.g., chest pain, back pain, breathing difficulty, swelling, rash, fever, numbness, weakness)     Pain left calf area "may be some swelling' 7. PREGNANCY: "Is there any chance you are pregnant?" "When was your last menstrual period?"       Na  Protocols used: Leg Pain-A-AH

## 2023-04-12 NOTE — Progress Notes (Signed)
Established Patient Office Visit  Subjective    Patient ID: Corey Smith, male    DOB: 1986/01/22  Age: 37 y.o. MRN: [id number]  CC: No chief complaint on file.   HPI Corey Smith states that he was playing basketball last night, states while running without contact, states that he felt a pop in his right calf and immediate pain.  States that he still has difficulty walking.  States that it has had a small amount of swelling.  States that he has tried icy hot patches, topical pain cream, muscle relaxers and 800 mg of ibuprofen without relief.     Outpatient Encounter Medications as of 04/12/2023  Medication Sig   buPROPion (WELLBUTRIN XL) 150 MG 24 hr tablet Take 1 tablet (150 mg total) by mouth every morning.   busPIRone (BUSPAR) 15 MG tablet Take 1 tablet (15 mg total) by mouth 3 (three) times daily.   carvedilol (COREG) 12.5 MG tablet Take 1 tablet (12.5 mg total) by mouth 2 (two) times daily with a meal.   cyclobenzaprine (FLEXERIL) 10 MG tablet Take 1 tablet (10 mg total) by mouth 3 (three) times daily.   fenofibrate (TRICOR) 145 MG tablet Take 1 tablet (145 mg total) by mouth daily.   gabapentin (NEURONTIN) 300 MG capsule Take 1 capsule (300 mg total) by mouth 3 (three) times daily.   hydrOXYzine (ATARAX) 25 MG tablet Take 1 tablet (25 mg total) by mouth 3 (three) times daily as needed.   oxcarbazepine (TRILEPTAL) 600 MG tablet Take 1 tablet (600 mg total) by mouth 2 (two) times daily.   rosuvastatin (CRESTOR) 20 MG tablet Take 1 tablet (20 mg total) by mouth daily.   traZODone (DESYREL) 100 MG tablet Take 2 tablets (200 mg total) by mouth at bedtime.   valsartan-hydrochlorothiazide (DIOVAN-HCT) 160-12.5 MG tablet Take 1 tablet by mouth daily.   venlafaxine XR (EFFEXOR XR) 75 MG 24 hr capsule Take 1 capsule (75 mg total) by mouth daily.   venlafaxine XR (EFFEXOR-XR) 150 MG 24 hr capsule Take 1 capsule (150 mg total) by mouth at bedtime.   No facility-administered encounter  medications on file as of 04/12/2023.    Past Medical History:  Diagnosis Date   ANXIETY 06/16/2010   Bipolar 1 disorder (HCC) 2018   Bipolar disorder (HCC)    FIBROMYALGIA 06/16/2010   HYPERLIPIDEMIA 06/16/2010   HYPERTRIGLYCERIDEMIA 06/16/2010   Impaired glucose tolerance 07/14/2011   SCOLIOSIS, MILD 02/16/2007   WISDOM TEETH EXTRACTION, HX OF 02/16/2007    Past Surgical History:  Procedure Laterality Date   CORNEAL TRANSPLANT Left 2019   WISDOM TOOTH EXTRACTION Bilateral 2003    Family History  Problem Relation Age of Onset   Breast cancer Mother    Hypertension Father    High Cholesterol Father    Depression Brother    Anxiety disorder Brother    Autism spectrum disorder Son    Hypertension Other    Anxiety disorder Other     Social History   Socioeconomic History   Marital status: Divorced    Spouse name: Not on file   Number of children: Not on file   Years of education: Not on file   Highest education level: Not on file  Occupational History   Not on file  Tobacco Use   Smoking status: Every Day    Current packs/day: 1.00    Average packs/day: 1 pack/day for 21.9 years (21.9 ttl pk-yrs)    Types: Cigarettes    Start  date: 2003   Smokeless tobacco: Never  Vaping Use   Vaping status: Never Used  Substance and Sexual Activity   Alcohol use: Not Currently    Comment: denies   Drug use: Not Currently    Comment: last use illicit substances 06/13/2019   Sexual activity: Yes    Partners: Female    Comment: Girlfriend is on oral contraception  Other Topics Concern   Not on file  Social History Narrative   Not on file   Social Determinants of Health   Financial Resource Strain: Not on file  Food Insecurity: Not on file  Transportation Needs: Not on file  Physical Activity: Not on file  Stress: Not on file  Social Connections: Not on file  Intimate Partner Violence: Not on file    Review of Systems  Constitutional: Negative.   HENT: Negative.     Eyes: Negative.   Respiratory:  Negative for shortness of breath.   Cardiovascular:  Positive for leg swelling. Negative for chest pain.  Gastrointestinal: Negative.   Genitourinary: Negative.   Musculoskeletal:  Positive for myalgias.  Skin: Negative.   Neurological: Negative.   Endo/Heme/Allergies: Negative.   Psychiatric/Behavioral: Negative.          Objective    BP 121/77 (BP Location: Left Arm, Patient Position: Sitting, Cuff Size: Large)   Pulse (!) 103   SpO2 99%   Physical Exam Vitals and nursing note reviewed.  Constitutional:      Appearance: Normal appearance.  HENT:     Head: Normocephalic and atraumatic.     Right Ear: External ear normal.     Left Ear: External ear normal.     Nose: Nose normal.     Mouth/Throat:     Mouth: Mucous membranes are moist.     Pharynx: Oropharynx is clear.  Eyes:     Conjunctiva/sclera: Conjunctivae normal.     Pupils: Pupils are equal, round, and reactive to light.  Cardiovascular:     Rate and Rhythm: Normal rate and regular rhythm.     Pulses: Normal pulses.          Dorsalis pedis pulses are 2+ on the right side and 2+ on the left side.       Posterior tibial pulses are 2+ on the right side and 2+ on the left side.     Heart sounds: Normal heart sounds.  Pulmonary:     Effort: Pulmonary effort is normal.     Breath sounds: Normal breath sounds.  Musculoskeletal:     Cervical back: Normal range of motion and neck supple.     Right lower leg: Swelling and tenderness present.     Left lower leg: Normal.     Right ankle: No swelling.     Right Achilles Tendon: No tenderness.     Left ankle: Normal.     Left Achilles Tendon: Normal.       Legs:     Comments: Slight swelling noted , tender to palpation   Skin:    General: Skin is warm and dry.  Neurological:     General: No focal deficit present.     Mental Status: He is alert and oriented to person, place, and time.  Psychiatric:        Mood and Affect: Mood  normal.        Thought Content: Thought content normal.        Judgment: Judgment normal.         Assessment &  Plan:   Problem List Items Addressed This Visit   None Visit Diagnoses     Pain of right calf    -  Primary   Relevant Orders   Ambulatory referral to Orthopedic Surgery     1. Pain of right calf Patient education given on supportive care, red flags given for prompt reevaluation. - Ambulatory referral to Orthopedic Surgery   I have reviewed the patient's medical history (PMH, PSH, Social History, Family History, Medications, and allergies) , and have been updated if relevant. I spent 20 minutes reviewing chart and  face to face time with patient.   Return if symptoms worsen or fail to improve.   Kasandra Knudsen Mayers, PA-C

## 2023-04-12 NOTE — Telephone Encounter (Signed)
Spoke with patient. Patient seen at [facility] . Patient is going to [facility] today.

## 2023-04-12 NOTE — Patient Instructions (Signed)
Continue using the ibuprofen and try icing the area as well.  I started a referral for you to be seen by [facility], however, consider seeing [facility] or another [facility].  Roney Jaffe, PA-C Physician Assistant [facility] [email or web address]   Muscle Strain A muscle strain is an injury that occurs when a muscle is stretched beyond its normal length. Usually, a small number of muscle fibers are torn when this happens. There are three types of muscle strains. First-degree strains have the least amount of muscle fiber tearing and the least amount of pain. Second-degree and third-degree strains have more tearing and pain. Usually, recovery from muscle strain takes 1-2 weeks. Complete healing normally takes 5-6 weeks. What are the causes? This condition is caused when a sudden, violent force is placed on a muscle and stretches it too far. This may occur with a fall, while lifting, or during sports. What increases the risk? This condition is more likely to develop in athletes and people who are physically active. What are the signs or symptoms? Symptoms of this condition include: Pain. Tenderness. Bruising. Swelling. Trouble using the muscle. How is this diagnosed? This condition is diagnosed based on a physical exam and your medical history. Tests may also be done, including an X-ray, ultrasound, or MRI. How is this treated? This condition is initially treated with PRICE therapy. This therapy involves: Protecting the muscle from being injured again. Resting the injured muscle. Icing the injured muscle. Applying pressure (compression) to the injured muscle. This may be done with a splint or elastic bandage. Raising (elevating) the injured muscle. Your health care provider may also recommend medicine for pain. Follow these instructions at home: If you have a removable splint: Wear the splint as told by your health care provider.  Remove it only as told by your health care provider. Check the skin around the splint every day. Tell your health care provider about any concerns. Loosen the splint if your fingers or toes tingle, become numb, or turn cold and blue. Keep the splint clean. If the splint is not waterproof: Do not let it get wet. Cover it with a watertight covering when you take a bath or a shower. Managing pain, stiffness, and swelling  If directed, put ice on the injured area. To do this: If you have a removable splint, remove it as told by your health care provider. Put ice in a plastic bag. Place a towel between your skin and the bag. Leave the ice on for 20 minutes, 2-3 times a day. Remove the ice if your skin turns bright red. This is very important. If you cannot feel pain, heat, or cold, you have a greater risk of damage to the area. Move your fingers or toes often to reduce stiffness and swelling. Raise (elevate) the injured area above the level of your heart while you are sitting or lying down. Wear an elastic bandage as told by your health care provider. Make sure that it is not too tight. General instructions Take over-the-counter and prescription medicines only as told by your health care provider. Treatment may include muscle relaxants or medicines for pain and inflammation that are taken by mouth or applied to the skin. Restrict your activity and rest the injured muscle as told by your health care provider. Gentle movements may be allowed. If physical therapy was prescribed, do exercises as told by your health care provider. Do not put pressure on any part of the splint until it is fully hardened.  This may take several hours. Do not use any products that contain nicotine or tobacco. These products include cigarettes, chewing tobacco, and vaping devices, such as e-cigarettes. If you need help quitting, ask your health care provider. Ask your health care provider when it is safe to drive if you  have a splint. Keep all follow-up visits. This is important. How is this prevented? Warm up before exercising. This helps to prevent future muscle strains. Contact a health care provider if: You have more pain or swelling in the injured area. Get help right away if: You have numbness or tingling in the injured area. You lose a lot of strength in the injured area. Summary A muscle strain is an injury that occurs when a muscle is stretched beyond its normal length. This condition is caused when a sudden, violent force is placed on a muscle and stretches it too far. This condition is initially treated with PRICE therapy, which involves protecting, resting, icing, compressing, and elevating. Gentle movements may be allowed. If physical therapy was prescribed, do exercises as told by your health care provider. This information is not intended to replace advice given to you by your health care provider. Make sure you discuss any questions you have with your health care provider. Document Revised: 08/29/2022 Document Reviewed: 07/13/2020 Elsevier Patient Education  2024 [employer].

## 2023-06-07 ENCOUNTER — Other Ambulatory Visit: Payer: Self-pay | Admitting: Critical Care Medicine

## 2023-06-07 NOTE — Telephone Encounter (Signed)
Requested medication (s) are due for refill today - unsure  Requested medication (s) are on the active medication list -yes  Future visit scheduled - no  Last refill: 12/16/22 #30  Notes to clinic: non delegated Rx- has notes  Requested Prescriptions  Pending Prescriptions Disp Refills   cyclobenzaprine (FLEXERIL) 10 MG tablet [Pharmacy Med Name: Cyclobenzaprine HCl 10MG  TABS] 60 tablet     Sig: TAKE 1 TABLET BY MOUTH THREE TIMES A DAY (NEED APPOINTMENT FOR FURTHER REFILLS)     Not Delegated - Analgesics:  Muscle Relaxants Failed - 06/07/2023 10:11 AM      Failed - This refill cannot be delegated      Passed - Valid encounter within last 6 months    Recent Outpatient Visits           3 months ago Decreased libido   [facility] [place]. [facility], [place], [place]   5 months ago Primary hypertension   [facility] [place]. [facility] Storm Frisk, MD   1 year ago Primary hypertension   [facility] [place]. [facility] Storm Frisk, MD   1 year ago Primary hypertension   [facility] [place]. [facility] Drucilla Chalet, RPH-CPP   1 year ago Primary hypertension   [facility] [place]. [facility] Storm Frisk, MD                 Requested Prescriptions  Pending Prescriptions Disp Refills   cyclobenzaprine (FLEXERIL) 10 MG tablet [Pharmacy Med Name: Cyclobenzaprine HCl 10MG  TABS] 60 tablet     Sig: TAKE 1 TABLET BY MOUTH THREE TIMES A DAY (NEED APPOINTMENT FOR FURTHER REFILLS)     Not Delegated - Analgesics:  Muscle Relaxants Failed - 06/07/2023 10:11 AM      Failed - This refill cannot be delegated      Passed - Valid encounter within last 6 months    Recent Outpatient Visits           3 months ago Decreased libido   [facility] [place]. [facility] [address], [place], [place]   5 months ago Primary hypertension   [facility] - [facility] Of [place]. [facility] Storm Frisk, MD   1 year ago Primary hypertension   [facility] [place]. [facility] Storm Frisk, MD   1 year ago Primary hypertension   [facility] Of [place]. [facility] Drucilla Chalet, RPH-CPP   1 year ago Primary hypertension   [facility] - [facility] Of [place]. [facility] Storm Frisk, MD

## 2023-08-02 ENCOUNTER — Other Ambulatory Visit (HOSPITAL_COMMUNITY): Payer: Self-pay | Admitting: Physician Assistant

## 2023-08-02 DIAGNOSIS — F319 Bipolar disorder, unspecified: Secondary | ICD-10-CM

## 2023-08-09 ENCOUNTER — Telehealth (INDEPENDENT_AMBULATORY_CARE_PROVIDER_SITE_OTHER): Payer: Self-pay | Admitting: Physician Assistant

## 2023-08-09 DIAGNOSIS — F319 Bipolar disorder, unspecified: Secondary | ICD-10-CM | POA: Diagnosis not present

## 2023-08-10 ENCOUNTER — Encounter (HOSPITAL_COMMUNITY): Payer: Self-pay | Admitting: Physician Assistant

## 2023-08-10 MED ORDER — HYDROXYZINE HCL 25 MG PO TABS: 25.0000 mg | ORAL_TABLET | Freq: Three times a day (TID) | ORAL | 2 refills | Status: DC | PRN

## 2023-08-10 MED ORDER — GABAPENTIN 300 MG PO CAPS: 300.0000 mg | ORAL_CAPSULE | Freq: Three times a day (TID) | ORAL | 3 refills | Status: DC

## 2023-08-10 MED ORDER — VENLAFAXINE HCL ER 75 MG PO CP24: 75.0000 mg | ORAL_CAPSULE | Freq: Every day | ORAL | 3 refills | Status: DC

## 2023-08-10 MED ORDER — OXCARBAZEPINE 600 MG PO TABS: 600.0000 mg | ORAL_TABLET | Freq: Two times a day (BID) | ORAL | 3 refills | Status: DC

## 2023-08-10 MED ORDER — VENLAFAXINE HCL ER 150 MG PO CP24: 150.0000 mg | ORAL_CAPSULE | Freq: Every evening | ORAL | 3 refills | Status: DC

## 2023-08-10 MED ORDER — BUPROPION HCL ER (XL) 150 MG PO TB24: 150.0000 mg | ORAL_TABLET | ORAL | 3 refills | Status: DC

## 2023-08-10 MED ORDER — TRAZODONE HCL 100 MG PO TABS: 200.0000 mg | ORAL_TABLET | Freq: Every evening | ORAL | 3 refills | Status: DC

## 2023-08-10 NOTE — Progress Notes (Signed)
 [facility] MD/PA/NP OP Progress Note  Virtual Visit via Video Note  I connected with Corey Smith on 08/09/23 at  2:30 PM EDT by a video enabled telemedicine application and verified that I am speaking with the correct person using two identifiers.  Location: Patient: Home Provider: Clinic   I discussed the limitations of evaluation and management by telemedicine and the availability of in person appointments. The patient expressed understanding and agreed to proceed.  Follow Up Instructions:   I discussed the assessment and treatment plan with the patient. The patient was provided an opportunity to ask questions and all were answered. The patient agreed with the plan and demonstrated an understanding of the instructions.   The patient was advised to call back or seek an in-person evaluation if the symptoms worsen or if the condition fails to improve as anticipated.  I provided 10 minutes of non-face-to-face time during this encounter.  Meta Hatchet, PA    08/09/2023 8:18 PM Corey Smith  MRN:  [id number]  Chief Complaint:  Chief Complaint  Patient presents with   Follow-up   Medication Refill   HPI:   Corey Smith "Corey Smith" is a 38 year old, Caucasian male with a past psychiatric history significant for bipolar 1 disorder who presents to [facility] via virtual video visit for follow up and medication management.  Patient is currently being prescribed the following psychiatric medications:  Trileptal 600 mg 2 times daily Effexor XR 75 mg in the morning and 150 mg at bedtime Bupropion (Wellbutrin XL) 150 mg 24-hour tablet daily Buspirone 15 mg 3 times daily Trazodone 200 mg at bedtime Hydroxyzine 25 mg 3 times daily as needed Gabapentin 300 mg 3 times daily  Patient reports no issues or concerns regarding his current medication regimen.  Patient denies experiencing any adverse side effects.  Patient denies experiencing manic episodes as  of late.  He denies overt depressive symptoms and states that his anxiety is under control.  The only stressor that the patient endorses is currently going through a separation with his wife.  He reports that they are currently working on things and is optimistic on the outlook.  A PHQ-9 screen was performed with the patient scoring a 5.  Patient is alert and oriented x 4, calm, cooperative, and fully engaged in conversation during the encounter.  Patient endorses good mood.  Patient exhibits euthymic mood with appropriate affect.  Patient denies suicidal or homicidal ideations.  He further denies auditory or visual hallucinations and does not appear to be responding to internal/external stimuli.  Patient endorses good sleep and receives on average 7 to 8 hours of sleep per night.  Patient endorses good appetite and eats on average 3 meals per day.  Patient denies alcohol consumption or illicit drug use.  Patient endorses tobacco use and smokes on average 1 pack/day.  Visit Diagnosis:    ICD-10-CM   1. Bipolar 1 disorder (HCC)  F31.9 oxcarbazepine (TRILEPTAL) 600 MG tablet    venlafaxine XR (EFFEXOR-XR) 75 MG 24 hr capsule    venlafaxine XR (EFFEXOR-XR) 150 MG 24 hr capsule    gabapentin (NEURONTIN) 300 MG capsule    hydrOXYzine (ATARAX) 25 MG tablet    traZODone (DESYREL) 100 MG tablet    buPROPion (WELLBUTRIN XL) 150 MG 24 hr tablet       Past Psychiatric History:  Diagnoses: bipolar 1 disorder, polysubstance use (opioids/heroin, cocaine, ecstasy, cannabis) in remission Medication trials: [place] Hospitalizations: yes Substance use:              --  Tobacco: 1 ppd             -- Etoh: denies             -- Denies recent illicit drug use                                    -- Last used heroin/fentanyl, cocaine in 2021  Past Medical History:  Past Medical History:  Diagnosis Date   ANXIETY 06/16/2010   Bipolar 1 disorder (HCC) 2018   Bipolar disorder (HCC)    FIBROMYALGIA 06/16/2010    HYPERLIPIDEMIA 06/16/2010   HYPERTRIGLYCERIDEMIA 06/16/2010   Impaired glucose tolerance 07/14/2011   SCOLIOSIS, MILD 02/16/2007   WISDOM TEETH EXTRACTION, HX OF 02/16/2007    Past Surgical History:  Procedure Laterality Date   CORNEAL TRANSPLANT Left 2019   WISDOM TOOTH EXTRACTION Bilateral 2003    Family Psychiatric History:  Mother - anxiety  Family History:  Family History  Problem Relation Age of Onset   Breast cancer Mother    Hypertension Father    High Cholesterol Father    Depression Brother    Anxiety disorder Brother    Autism spectrum disorder Son    Hypertension Other    Anxiety disorder Other     Social History:  Social History   Socioeconomic History   Marital status: Divorced    Spouse name: Not on file   Number of children: Not on file   Years of education: Not on file   Highest education level: Not on file  Occupational History   Not on file  Tobacco Use   Smoking status: Every Day    Current packs/day: 1.00    Average packs/day: 1 pack/day for 22.3 years (22.3 ttl pk-yrs)    Types: Cigarettes    Start date: 2003   Smokeless tobacco: Never  Vaping Use   Vaping status: Never Used  Substance and Sexual Activity   Alcohol use: Not Currently    Comment: denies   Drug use: Not Currently    Comment: last use illicit substances 06/13/2019   Sexual activity: Yes    Partners: Female    Comment: Girlfriend is on oral contraception  Other Topics Concern   Not on file  Social History Narrative   Not on file   Social Drivers of Health   Financial Resource Strain: Not on file  Food Insecurity: Not on file  Transportation Needs: Not on file  Physical Activity: Not on file  Stress: Not on file  Social Connections: Not on file    Allergies: No Known Allergies  Metabolic Disorder Labs: Lab Results  Component Value Date   HGBA1C 5.5 12/14/2021   No results found for: "PROLACTIN" Lab Results  Component Value Date   CHOL 271 (H) 12/16/2022    TRIG 354 (H) 12/16/2022   HDL 36 (L) 12/16/2022   CHOLHDL 7.5 (H) 12/16/2022   VLDL 26.2 07/11/2012   LDLCALC 167 (H) 12/16/2022   LDLCALC 160 (H) 12/14/2021   Lab Results  Component Value Date   TSH 0.389 (L) 12/14/2021   TSH 0.549 05/25/2019    Therapeutic Level Labs: No results found for: "LITHIUM" No results found for: "VALPROATE" No results found for: "CBMZ"  Current Medications: Current Outpatient Medications  Medication Sig Dispense Refill   buPROPion (WELLBUTRIN XL) 150 MG 24 hr tablet Take 1 tablet (150 mg total) by mouth every morning. 30 tablet  3   carvedilol (COREG) 12.5 MG tablet Take 1 tablet (12.5 mg total) by mouth 2 (two) times daily with a meal. 60 tablet 3   cyclobenzaprine (FLEXERIL) 10 MG tablet TAKE 1 TABLET BY MOUTH THREE TIMES A DAY (NEED APPOINTMENT FOR FURTHER REFILLS) 60 tablet 0   fenofibrate (TRICOR) 145 MG tablet Take 1 tablet (145 mg total) by mouth daily. 30 tablet 3   gabapentin (NEURONTIN) 300 MG capsule Take 1 capsule (300 mg total) by mouth 3 (three) times daily. 90 capsule 3   hydrOXYzine (ATARAX) 25 MG tablet Take 1 tablet (25 mg total) by mouth 3 (three) times daily as needed. 75 tablet 2   oxcarbazepine (TRILEPTAL) 600 MG tablet Take 1 tablet (600 mg total) by mouth 2 (two) times daily. 60 tablet 3   rosuvastatin (CRESTOR) 20 MG tablet Take 1 tablet (20 mg total) by mouth daily. 60 tablet 3   traZODone (DESYREL) 100 MG tablet Take 2 tablets (200 mg total) by mouth at bedtime. 60 tablet 3   valsartan-hydrochlorothiazide (DIOVAN-HCT) 160-12.5 MG tablet Take 1 tablet by mouth daily. 30 tablet 3   venlafaxine XR (EFFEXOR-XR) 150 MG 24 hr capsule Take 1 capsule (150 mg total) by mouth at bedtime. 30 capsule 3   venlafaxine XR (EFFEXOR-XR) 75 MG 24 hr capsule Take 1 capsule (75 mg total) by mouth daily. 30 capsule 3   No current facility-administered medications for this visit.     Musculoskeletal: Strength & Muscle Tone: within normal  limits Gait & Station: normal Patient leans: N/A  Psychiatric Specialty Exam: Review of Systems  Psychiatric/Behavioral:  Negative for decreased concentration, dysphoric mood, hallucinations, self-injury, sleep disturbance and suicidal ideas. The patient is not nervous/anxious and is not hyperactive.     There were no vitals taken for this visit.There is no height or weight on file to calculate BMI.  General Appearance: Casual  Eye Contact:  Good  Speech:  Clear and Coherent and Normal Rate  Volume:  Normal  Mood:  Euthymic  Affect:  Appropriate  Thought Process:  Coherent and Descriptions of Associations: Intact  Orientation:  Full (Time, Place, and Person)  Thought Content: WDL   Suicidal Thoughts:  No  Homicidal Thoughts:  No  Memory:  Immediate;   Good Recent;   Fair Remote;   Fair  Judgement:  Fair  Insight:  Good  Psychomotor Activity:  Normal  Concentration:  Concentration: Good and Attention Span: Good  Recall:  Good  Fund of Knowledge: Good  Language: Good  Akathisia:  No  Handed:  Right  AIMS (if indicated): not done  Assets:  Communication Skills Desire for Improvement Financial Resources/Insurance Housing Vocational/Educational  ADL's:  Intact  Cognition: WNL  Sleep:  Good   Screenings: AIMS    Flowsheet Row Admission (Discharged) from OP Visit from 05/24/2019 in [facility]  AIMS Total Score 0      AUDIT    Flowsheet Row Admission (Discharged) from OP Visit from 05/24/2019 in [facility]  Alcohol Use Disorder Identification Test Final Score (AUDIT) 0      GAD-7    Flowsheet Row Video Visit from 08/09/2023 in [facility] Video Visit from 02/07/2023 in [facility] Office Visit from 12/16/2022 in [facility] . [facility] Video Visit from 11/08/2022 in [facility] Video Visit  from 08/02/2022 in [facility]  Total GAD-7 Score 5 5 0 6 7      PHQ2-9    Flowsheet Row Video Visit from 08/09/2023 in [facility] Video Visit from 02/07/2023 in [facility] Office Visit from 12/16/2022 in [facility] Of [place]. [facility] Video Visit from 11/08/2022 in [facility] Video Visit from 08/02/2022 in [facility]  PHQ-2 Total Score 1 2 0 1 3  PHQ-9 Total Score -- 5 -- -- 10      Flowsheet Row Video Visit from 08/09/2023 in [facility] Video Visit from 02/07/2023 in [facility] Video Visit from 11/08/2022 in [facility]  C-SSRS RISK CATEGORY Moderate Risk Low Risk Low Risk        Assessment and Plan:   Corey Smith "Corey Smith" is a 38 year old, Caucasian male with a past psychiatric history significant for bipolar 1 disorder who presents to [facility] via virtual video visit for follow up and medication management.  Patient presents today encounter reporting no issues or concerns regarding his current medication regimen.  Patient denies experiencing any adverse side effects and further denies the need for dosage adjustments at this time.  Patient denies experiencing any manic episodes as of late.  Patient further denies overt depressive symptoms nor does he endorse anxiety at this time.  Patient endorses stability on his current medication regimen and would like to continue taking them as prescribed.  Patient's medications to be e-prescribed through pharmacy of choice.  Collaboration of Care: Collaboration of Care: Medication Management AEB provider managing patient's psychiatric medications, Primary Care Provider AEB patient being followed by primary care provider in internal medicine,  and Psychiatrist AEB patient being followed by a mental health provider  Patient/Guardian was advised Release of Information must be obtained prior to any record release in order to collaborate their care with an outside provider. Patient/Guardian was advised if they have not already done so to contact the registration department to sign all necessary forms in order for [place] to release information regarding their care.   Consent: Patient/Guardian gives verbal consent for treatment and assignment of benefits for services provided during this visit. Patient/Guardian expressed understanding and agreed to proceed.   1. Bipolar 1 disorder (HCC)  - oxcarbazepine (TRILEPTAL) 600 MG tablet; Take 1 tablet (600 mg total) by mouth 2 (two) times daily.  Dispense: 60 tablet; Refill: 3 - venlafaxine XR (EFFEXOR-XR) 75 MG 24 hr capsule; Take 1 capsule (75 mg total) by mouth daily.  Dispense: 30 capsule; Refill: 3 - venlafaxine XR (EFFEXOR-XR) 150 MG 24 hr capsule; Take 1 capsule (150 mg total) by mouth at bedtime.  Dispense: 30 capsule; Refill: 3 - gabapentin (NEURONTIN) 300 MG capsule; Take 1 capsule (300 mg total) by mouth 3 (three) times daily.  Dispense: 90 capsule; Refill: 3 - hydrOXYzine (ATARAX) 25 MG tablet; Take 1 tablet (25 mg total) by mouth 3 (three) times daily as needed.  Dispense: 75 tablet; Refill: 2 - traZODone (DESYREL) 100 MG tablet; Take 2 tablets (200 mg total) by mouth at bedtime.  Dispense: 60 tablet; Refill: 3 - buPROPion (WELLBUTRIN XL) 150 MG 24 hr tablet; Take 1 tablet (150 mg total) by mouth every morning.  Dispense: 30 tablet; Refill: 3  Patient to follow-up in 2 months Provider spent a total of 10 minutes with the patient/reviewing patient's chart  Meta Hatchet, PA 08/09/2023, 8:18 PM

## 2023-09-29 ENCOUNTER — Other Ambulatory Visit: Payer: Self-pay | Admitting: Critical Care Medicine

## 2023-09-29 DIAGNOSIS — F319 Bipolar disorder, unspecified: Secondary | ICD-10-CM

## 2023-09-29 NOTE — Telephone Encounter (Signed)
 Copied from CRM [id number]. Topic: Clinical - Medication Refill >> Sep 29, 2023 10:27 AM Antwanette L wrote: Medication: gabapentin  (NEURONTIN ) 300 MG capsule  Has the patient contacted their pharmacy? Yes   This is the patient's preferred pharmacy:  [facility], [place] - [address] [place] [address] Phone: [phone] Fax: [phone]   Is this the correct pharmacy for this prescription? Yes   Has the prescription been filled recently? No.Last refilled on 08/10/23  Is the patient out of the medication? NO  Has the patient been seen for an appointment in the last year OR does the patient have an upcoming appointment? Yes. Patient has an upcoming appt on 10/12/23  Can we respond through MyChart? No. Contact patient by phone at [phone]  Agent: Please be advised that Rx refills may take up to 3 business days. We ask that you follow-up with your pharmacy.

## 2023-10-03 NOTE — Telephone Encounter (Signed)
 Requested medication (s) are due for refill today: yes  Requested medication (s) are on the active medication list: yes  Last refill:  08/10/23  Future visit scheduled: yes  Notes to clinic:  Unable to refill per protocol, last refill by another provider.      Requested Prescriptions  Pending Prescriptions Disp Refills   gabapentin  (NEURONTIN ) 300 MG capsule 90 capsule 3    Sig: Take 1 capsule (300 mg total) by mouth 3 (three) times daily.     Neurology: Anticonvulsants - gabapentin  Passed - 10/03/2023 12:13 PM      Passed - Cr in normal range and within 360 days    Creatinine, Ser  Date Value Ref Range Status  12/16/2022 0.98 0.76 - 1.27 mg/dL Final         Passed - Completed PHQ-2 or PHQ-9 in the last 360 days      Passed - Valid encounter within last 12 months    Recent Outpatient Visits           7 months ago Decreased libido   [facility] Of [place]. [facility], Shelvy Dickens M, [place]   9 months ago Primary hypertension   [facility] Of [place]. [facility] Vernell Goldsmith, MD   1 year ago Primary hypertension   [facility] Of [place]. [facility] Vernell Goldsmith, MD   1 year ago Primary hypertension   [facility] Of [place]. [facility] Valente Gaskin, RPH-CPP   1 year ago Primary hypertension   [facility] Dept Of [place]. [facility] Vernell Goldsmith, MD

## 2023-10-11 ENCOUNTER — Telehealth (INDEPENDENT_AMBULATORY_CARE_PROVIDER_SITE_OTHER): Payer: Self-pay

## 2023-10-11 ENCOUNTER — Telehealth (INDEPENDENT_AMBULATORY_CARE_PROVIDER_SITE_OTHER): Payer: Self-pay | Admitting: Primary Care

## 2023-10-11 ENCOUNTER — Telehealth (INDEPENDENT_AMBULATORY_CARE_PROVIDER_SITE_OTHER): Admitting: Physician Assistant

## 2023-10-11 DIAGNOSIS — F319 Bipolar disorder, unspecified: Secondary | ICD-10-CM

## 2023-10-11 NOTE — Telephone Encounter (Signed)
 Called pt to confirm appt. Pt hung up phone. Please advise.

## 2023-10-11 NOTE — Telephone Encounter (Signed)
 Copied from CRM [id number]. Topic: Appointments - Appointment Info/Confirmation >> Oct 11, 2023 11:05 AM Opal Bill wrote: Patient calling to confirm his appointment. Advised that this appointment is set for In Person. Patient says he needs his appointment switched to virtual. He is not able to come in person. Please contact him back once this is switched. Thank you.

## 2023-10-11 NOTE — Telephone Encounter (Signed)
 Provider would like pt to come in person.

## 2023-10-12 ENCOUNTER — Ambulatory Visit (INDEPENDENT_AMBULATORY_CARE_PROVIDER_SITE_OTHER): Admitting: Primary Care

## 2023-10-12 ENCOUNTER — Telehealth (INDEPENDENT_AMBULATORY_CARE_PROVIDER_SITE_OTHER): Payer: Self-pay | Admitting: Primary Care

## 2023-10-12 MED ORDER — VENLAFAXINE HCL ER 75 MG PO CP24: 75.0000 mg | ORAL_CAPSULE | Freq: Every day | ORAL | 3 refills | Status: AC

## 2023-10-12 MED ORDER — VENLAFAXINE HCL ER 150 MG PO CP24
150.0000 mg | ORAL_CAPSULE | Freq: Every evening | ORAL | 3 refills | Status: AC
Start: 2023-10-12 — End: 2024-02-09

## 2023-10-12 MED ORDER — GABAPENTIN 400 MG PO CAPS: 400.0000 mg | ORAL_CAPSULE | Freq: Three times a day (TID) | ORAL | 3 refills | Status: DC

## 2023-10-12 MED ORDER — OXCARBAZEPINE 600 MG PO TABS: 600.0000 mg | ORAL_TABLET | Freq: Two times a day (BID) | ORAL | 3 refills | Status: DC

## 2023-10-12 MED ORDER — TRAZODONE HCL 100 MG PO TABS: 200.0000 mg | ORAL_TABLET | Freq: Every evening | ORAL | 3 refills | Status: DC

## 2023-10-12 MED ORDER — HYDROXYZINE HCL 25 MG PO TABS: 25.0000 mg | ORAL_TABLET | Freq: Three times a day (TID) | ORAL | 2 refills | Status: AC | PRN

## 2023-10-12 MED ORDER — BUPROPION HCL ER (XL) 150 MG PO TB24: 150.0000 mg | ORAL_TABLET | ORAL | 3 refills | Status: DC

## 2023-10-12 NOTE — Telephone Encounter (Signed)
 Called pt to reschedule appt. Pt was able to reschedule with me at this time.

## 2023-10-12 NOTE — Progress Notes (Signed)
 BH MD/PA/NP OP Progress Note  Virtual Visit via Video Note  I connected with Corey Smith on 10/11/23 at  4:00 PM EDT by a video enabled telemedicine application and verified that I am speaking with the correct person using two identifiers.  Location: Patient: Home Provider: Clinic   I discussed the limitations of evaluation and management by telemedicine and the availability of in person appointments. The patient expressed understanding and agreed to proceed.  Follow Up Instructions:   I discussed the assessment and treatment plan with the patient. The patient was provided an opportunity to ask questions and all were answered. The patient agreed with the plan and demonstrated an understanding of the instructions.   The patient was advised to call back or seek an in-person evaluation if the symptoms worsen or if the condition fails to improve as anticipated.  I provided 11 minutes of non-face-to-face time during this encounter.  Lorena Clearman E [employer] League, PA    10/11/2023 4:00 PM Corey Smith  MRN:  [id number]  Chief Complaint:  Chief Complaint  Patient presents with   Follow-up   Medication Refill   HPI:   Corey Smith "Wes" is a 38 year old, Caucasian male with a past psychiatric history significant for bipolar 1 disorder who presents to [facility] via virtual video visit for follow up and medication management.  Patient is currently being prescribed the following psychiatric medications:  Trileptal  600 mg 2 times daily Effexor  XR 75 mg in the morning and 150 mg at bedtime Bupropion  (Wellbutrin  XL) 150 mg 24-hour tablet daily Buspirone  15 mg 3 times daily Trazodone  200 mg at bedtime Hydroxyzine  25 mg 3 times daily as needed Gabapentin  300 mg 3 times daily  Patient presents to the encounter stating that he has been taking his medications regularly.  Patient denies experiencing any adverse side effects regarding his current medication  regimen.  Patient denies overt depressive symptoms but does endorse some anxiety.  He reports that his anxiety is manageable at this time.  A PHQ-9 screen was performed the patient scoring a 3.  A GAD-7 screen was also performed with the patient scoring a 6.  Patient is alert and oriented x 4, calm, cooperative, and fully engaged in conversation during the encounter.  Patient endorses good mood.  Patient exhibits euthymic mood with appropriate affect.  Patient denies suicidal or homicidal ideations.  He further denies auditory or visual hallucinations and does not appear to be responding to internal/external stimuli.  Patient endorses good sleep and receives on average 8 hours of sleep per night.  Patient endorses fair appetite and eats on average 3 meals per day.  Patient denies alcohol consumption or illicit drug use.  Patient endorses tobacco use and smokes on average 1 pack/day.  Visit Diagnosis:    ICD-10-CM   1. Bipolar 1 disorder (HCC)  F31.9 gabapentin  (NEURONTIN ) 400 MG capsule    buPROPion  (WELLBUTRIN  XL) 150 MG 24 hr tablet    venlafaxine  XR (EFFEXOR -XR) 150 MG 24 hr capsule    traZODone  (DESYREL ) 100 MG tablet    hydrOXYzine  (ATARAX ) 25 MG tablet    oxcarbazepine  (TRILEPTAL ) 600 MG tablet    venlafaxine  XR (EFFEXOR -XR) 75 MG 24 hr capsule      Past Psychiatric History:  Diagnoses: bipolar 1 disorder, polysubstance use (opioids/heroin, cocaine, ecstasy, cannabis) in remission Medication trials: Latuda  Hospitalizations: yes Substance use:              -- Tobacco: 1 ppd             --  Etoh: denies             -- Denies recent illicit drug use                                    -- Last used heroin/fentanyl, cocaine in 2021  Past Medical History:  Past Medical History:  Diagnosis Date   ANXIETY 06/16/2010   Bipolar 1 disorder (HCC) 2018   Bipolar disorder (HCC)    FIBROMYALGIA 06/16/2010   HYPERLIPIDEMIA 06/16/2010   HYPERTRIGLYCERIDEMIA 06/16/2010   Impaired glucose  tolerance 07/14/2011   SCOLIOSIS, MILD 02/16/2007   WISDOM TEETH EXTRACTION, HX OF 02/16/2007    Past Surgical History:  Procedure Laterality Date   CORNEAL TRANSPLANT Left 2019   WISDOM TOOTH EXTRACTION Bilateral 2003    Family Psychiatric History:  Mother - anxiety  Family History:  Family History  Problem Relation Age of Onset   Breast cancer Mother    Hypertension Father    High Cholesterol Father    Depression Brother    Anxiety disorder Brother    Autism spectrum disorder Son    Hypertension Other    Anxiety disorder Other     Social History:  Social History   Socioeconomic History   Marital status: Divorced    Spouse name: Not on file   Number of children: Not on file   Years of education: Not on file   Highest education level: Not on file  Occupational History   Not on file  Tobacco Use   Smoking status: Every Day    Current packs/day: 1.00    Average packs/day: 1 pack/day for 22.4 years (22.4 ttl pk-yrs)    Types: Cigarettes    Start date: 2003   Smokeless tobacco: Never  Vaping Use   Vaping status: Never Used  Substance and Sexual Activity   Alcohol use: Not Currently    Comment: denies   Drug use: Not Currently    Comment: last use illicit substances 06/13/2019   Sexual activity: Yes    Partners: Female    Comment: Girlfriend is on oral contraception  Other Topics Concern   Not on file  Social History Narrative   Not on file   Social Drivers of Health   Financial Resource Strain: Not on file  Food Insecurity: Not on file  Transportation Needs: Not on file  Physical Activity: Not on file  Stress: Not on file  Social Connections: Not on file    Allergies: No Known Allergies  Metabolic Disorder Labs: Lab Results  Component Value Date   HGBA1C 5.5 12/14/2021   No results found for: "PROLACTIN" Lab Results  Component Value Date   CHOL 271 (H) 12/16/2022   TRIG 354 (H) 12/16/2022   HDL 36 (L) 12/16/2022   CHOLHDL 7.5 (H) 12/16/2022    VLDL 26.2 07/11/2012   LDLCALC 167 (H) 12/16/2022   LDLCALC 160 (H) 12/14/2021   Lab Results  Component Value Date   TSH 0.389 (L) 12/14/2021   TSH 0.549 05/25/2019    Therapeutic Level Labs: No results found for: "LITHIUM" No results found for: "VALPROATE" No results found for: "CBMZ"  Current Medications: Current Outpatient Medications  Medication Sig Dispense Refill   buPROPion  (WELLBUTRIN  XL) 150 MG 24 hr tablet Take 1 tablet (150 mg total) by mouth every morning. 30 tablet 3   carvedilol  (COREG ) 12.5 MG tablet Take 1 tablet (12.5 mg total) by mouth  2 (two) times daily with a meal. 60 tablet 3   cyclobenzaprine  (FLEXERIL ) 10 MG tablet TAKE 1 TABLET BY MOUTH THREE TIMES A DAY (NEED APPOINTMENT FOR FURTHER REFILLS) 60 tablet 0   fenofibrate  (TRICOR ) 145 MG tablet Take 1 tablet (145 mg total) by mouth daily. 30 tablet 3   gabapentin  (NEURONTIN ) 400 MG capsule Take 1 capsule (400 mg total) by mouth 3 (three) times daily. 90 capsule 3   hydrOXYzine  (ATARAX ) 25 MG tablet Take 1 tablet (25 mg total) by mouth 3 (three) times daily as needed. 75 tablet 2   oxcarbazepine  (TRILEPTAL ) 600 MG tablet Take 1 tablet (600 mg total) by mouth 2 (two) times daily. 60 tablet 3   rosuvastatin  (CRESTOR ) 20 MG tablet Take 1 tablet (20 mg total) by mouth daily. 60 tablet 3   traZODone  (DESYREL ) 100 MG tablet Take 2 tablets (200 mg total) by mouth at bedtime. 60 tablet 3   valsartan -hydrochlorothiazide  (DIOVAN -HCT) 160-12.5 MG tablet Take 1 tablet by mouth daily. 30 tablet 3   venlafaxine  XR (EFFEXOR -XR) 150 MG 24 hr capsule Take 1 capsule (150 mg total) by mouth at bedtime. 30 capsule 3   venlafaxine  XR (EFFEXOR -XR) 75 MG 24 hr capsule Take 1 capsule (75 mg total) by mouth daily. 30 capsule 3   No current facility-administered medications for this visit.     Musculoskeletal: Strength & Muscle Tone: within normal limits Gait & Station: normal Patient leans: N/A  Psychiatric Specialty  Exam: Review of Systems  Psychiatric/Behavioral:  Negative for decreased concentration, dysphoric mood, hallucinations, self-injury, sleep disturbance and suicidal ideas. The patient is not nervous/anxious and is not hyperactive.     There were no vitals taken for this visit.There is no height or weight on file to calculate BMI.  General Appearance: Casual  Eye Contact:  Good  Speech:  Clear and Coherent and Normal Rate  Volume:  Normal  Mood:  Euthymic  Affect:  Appropriate  Thought Process:  Coherent and Descriptions of Associations: Intact  Orientation:  Full (Time, Place, and Person)  Thought Content: WDL   Suicidal Thoughts:  No  Homicidal Thoughts:  No  Memory:  Immediate;   Good Recent;   Fair Remote;   Fair  Judgement:  Fair  Insight:  Good  Psychomotor Activity:  Normal  Concentration:  Concentration: Good and Attention Span: Good  Recall:  Good  Fund of Knowledge: Good  Language: Good  Akathisia:  No  Handed:  Right  AIMS (if indicated): not done  Assets:  Communication Skills Desire for Improvement Financial Resources/Insurance Housing Vocational/Educational  ADL's:  Intact  Cognition: WNL  Sleep:  Good   Screenings: AIMS    Flowsheet Row Admission (Discharged) from OP Visit from 05/24/2019 in [facility]  AIMS Total Score 0      AUDIT    Flowsheet Row Admission (Discharged) from OP Visit from 05/24/2019 in [facility]  Alcohol Use Disorder Identification Test Final Score (AUDIT) 0      GAD-7    Flowsheet Row Video Visit from 10/11/2023 in [facility] Video Visit from 08/09/2023 in [facility] Video Visit from 02/07/2023 in [facility] Office Visit from 12/16/2022 in [facility] Video Visit from 11/08/2022 in [facility]  Total GAD-7 Score  6 5 5  0 6      PHQ2-9  Flowsheet Row Video Visit from 10/11/2023 in [facility] Video Visit from 08/09/2023 in [facility] Video Visit from 02/07/2023 in [facility] Office Visit from 12/16/2022 in [facility] [place] Video Visit from 11/08/2022 in [facility]  PHQ-2 Total Score 2 1 2  0 1  PHQ-9 Total Score 3 -- 5 -- --      Flowsheet Row Video Visit from 10/11/2023 in [facility] Video Visit from 08/09/2023 in [facility] Video Visit from 02/07/2023 in [facility]  C-SSRS RISK CATEGORY Moderate Risk Moderate Risk Low Risk        Assessment and Plan:   Corey Smith "Wes" is a 38 year old, Caucasian male with a past psychiatric history significant for bipolar 1 disorder who presents to [facility] via virtual video visit for follow up and medication management.  Patient presents to the encounter stating that he has been taking his medications regularly.  Patient denies experiencing any adverse side effects at this time.  Patient denies overt depressive symptoms; however, a PHQ-9 screen was performed with the patient scoring a 3.  Patient endorses some anxiety but states that his symptoms are manageable at this time.  A GAD-7 screen was performed with the patient scoring a 6.  Patient does express that he has been experiencing shooting pain that extends from his shoulders and travels to his forearms.  He reports that the pain has been going on for more than a month and states that it is constant.  He reports that gabapentin  has been helpful in managing the pain.  Patient is interested in adjusting his dosage of gabapentin  for the management of his pain.  Provider also informed patient that gabapentin  could be  used to help with managing his anxiety.  Provider adjusted patient's gabapentin  dosage from 300 mg to 400 mg 3 times daily for the management of his shooting pain and anxiety.  Patient was agreeable to recommendation.  Patient to continue taking all other medications as prescribed.  Patient's medications to be e-prescribed to pharmacy of choice.  A [place] Suicide Severity Rating Scale was performed with the patient being considered moderate risk. Patient denies suicidal ideations and is able to contract for safety following the conclusion of the encounter.  Collaboration of Care: Collaboration of Care: Medication Management AEB provider managing patient's psychiatric medications, Primary Care Provider AEB patient being followed by primary care provider in internal medicine, and Psychiatrist AEB patient being followed by a mental health provider  Patient/Guardian was advised Release of Information must be obtained prior to any record release in order to collaborate their care with an outside provider. Patient/Guardian was advised if they have not already done so to contact the registration department to sign all necessary forms in order for us  to release information regarding their care.   Consent: Patient/Guardian gives verbal consent for treatment and assignment of benefits for services provided during this visit. Patient/Guardian expressed understanding and agreed to proceed.   1. Bipolar 1 disorder (HCC)  - gabapentin  (NEURONTIN ) 400 MG capsule; Take 1 capsule (400 mg total) by mouth 3 (three) times daily.  Dispense: 90 capsule; Refill: 3 - buPROPion  (WELLBUTRIN  XL) 150 MG 24 hr tablet; Take 1 tablet (150 mg total) by mouth every morning.  Dispense: 30 tablet; Refill: 3 - venlafaxine  XR (EFFEXOR -XR) 150  MG 24 hr capsule; Take 1 capsule (150 mg total) by mouth at bedtime.  Dispense: 30 capsule; Refill: 3 - traZODone  (DESYREL ) 100 MG tablet; Take 2 tablets (200 mg total) by mouth at bedtime.   Dispense: 60 tablet; Refill: 3 - hydrOXYzine  (ATARAX ) 25 MG tablet; Take 1 tablet (25 mg total) by mouth 3 (three) times daily as needed.  Dispense: 75 tablet; Refill: 2 - oxcarbazepine  (TRILEPTAL ) 600 MG tablet; Take 1 tablet (600 mg total) by mouth 2 (two) times daily.  Dispense: 60 tablet; Refill: 3 - venlafaxine  XR (EFFEXOR -XR) 75 MG 24 hr capsule; Take 1 capsule (75 mg total) by mouth daily.  Dispense: 30 capsule; Refill: 3  Patient to follow-up in 3 months Provider spent a total of 11 minutes with the patient/reviewing patient's chart  Gates Kasal, PA 10/11/2023, 4:00 PM

## 2023-10-17 ENCOUNTER — Encounter (HOSPITAL_COMMUNITY): Payer: Self-pay | Admitting: Physician Assistant

## 2023-10-17 ENCOUNTER — Other Ambulatory Visit: Payer: Self-pay | Admitting: Critical Care Medicine

## 2023-10-18 ENCOUNTER — Telehealth (INDEPENDENT_AMBULATORY_CARE_PROVIDER_SITE_OTHER): Payer: Self-pay | Admitting: Primary Care

## 2023-10-18 NOTE — Telephone Encounter (Signed)
 Called pt to confirm appt. Pt stated that 4pm would be the best time to call. Please advise.

## 2023-10-19 ENCOUNTER — Telehealth (INDEPENDENT_AMBULATORY_CARE_PROVIDER_SITE_OTHER): Admitting: Primary Care

## 2023-10-19 DIAGNOSIS — I10 Essential (primary) hypertension: Secondary | ICD-10-CM | POA: Diagnosis not present

## 2023-10-19 DIAGNOSIS — F319 Bipolar disorder, unspecified: Secondary | ICD-10-CM

## 2023-10-19 DIAGNOSIS — E782 Mixed hyperlipidemia: Secondary | ICD-10-CM

## 2023-10-19 DIAGNOSIS — R000 Tachycardia, unspecified: Secondary | ICD-10-CM

## 2023-10-19 DIAGNOSIS — Z79899 Other long term (current) drug therapy: Secondary | ICD-10-CM

## 2023-10-19 NOTE — Progress Notes (Signed)
 [facility]  Virtual Visit Note  I connected with Corey Smith, on 10/19/2023 at 9:06 AM through an audio and video application and verified that I am speaking with the correct person using two identifiers.   Consent: I discussed the limitations, risks, security and privacy concerns of performing an evaluation and management service by mychart and the availability of in person appointments. I also discussed with the patient that there may be a patient responsible charge related to this service. The patient expressed understanding and agreed to proceed.   Location of Patient: work  Government social research officer of Provider: [facility]   Persons participating in visit: Rhodia Cera,  NP   History of Present Illness: Mr. Corey Smith is 38 year old male previously followed by Dr. Brent Cambric and is requesting medication refills.  Explained to the patient unable to refill medication without recent labs.  He is also on high risk medication prescribed by behavioral health.Patient has No headache, No chest pain, No abdominal pain - No Nausea, No new weakness tingling or numbness, No Cough - shortness of breath.  Patient will be in today to have labs drawn and a blood pressure check.  After reviewed will send in appropriate medication.   Past Medical History:  Diagnosis Date   ANXIETY 06/16/2010   Bipolar 1 disorder (HCC) 2018   Bipolar disorder (HCC)    FIBROMYALGIA 06/16/2010   HYPERLIPIDEMIA 06/16/2010   HYPERTRIGLYCERIDEMIA 06/16/2010   Impaired glucose tolerance 07/14/2011   SCOLIOSIS, MILD 02/16/2007   WISDOM TEETH EXTRACTION, HX OF 02/16/2007   No Known Allergies  Current Outpatient Medications on File Prior to Visit  Medication Sig Dispense Refill   buPROPion  (WELLBUTRIN  XL) 150 MG 24 hr tablet Take 1 tablet (150 mg total) by mouth every morning. 30 tablet 3   carvedilol  (COREG ) 12.5 MG tablet Take 1 tablet  (12.5 mg total) by mouth 2 (two) times daily with a meal. 60 tablet 3   cyclobenzaprine  (FLEXERIL ) 10 MG tablet TAKE 1 TABLET BY MOUTH THREE TIMES A DAY (NEED APPOINTMENT FOR FURTHER REFILLS) 60 tablet 0   fenofibrate  (TRICOR ) 145 MG tablet Take 1 tablet (145 mg total) by mouth daily. 30 tablet 3   gabapentin  (NEURONTIN ) 400 MG capsule Take 1 capsule (400 mg total) by mouth 3 (three) times daily. 90 capsule 3   hydrOXYzine  (ATARAX ) 25 MG tablet Take 1 tablet (25 mg total) by mouth 3 (three) times daily as needed. 75 tablet 2   oxcarbazepine  (TRILEPTAL ) 600 MG tablet Take 1 tablet (600 mg total) by mouth 2 (two) times daily. 60 tablet 3   rosuvastatin  (CRESTOR ) 20 MG tablet Take 1 tablet (20 mg total) by mouth daily. 60 tablet 3   traZODone  (DESYREL ) 100 MG tablet Take 2 tablets (200 mg total) by mouth at bedtime. 60 tablet 3   valsartan -hydrochlorothiazide  (DIOVAN -HCT) 160-12.5 MG tablet Take 1 tablet by mouth daily. 30 tablet 3   venlafaxine  XR (EFFEXOR -XR) 150 MG 24 hr capsule Take 1 capsule (150 mg total) by mouth at bedtime. 30 capsule 3   venlafaxine  XR (EFFEXOR -XR) 75 MG 24 hr capsule Take 1 capsule (75 mg total) by mouth daily. 30 capsule 3   No current facility-administered medications on file prior to visit.    Observations/Objective: See HPI  Assessment and Plan: Diagnoses and all orders for this visit:  Mixed hyperlipidemia -     Lipid panel; Future  Bipolar 1 disorder (HCC) Followed by behavior  health  Primary hypertension -     CMP14+EGFR; Future  History of long-term treatment with high-risk medication -     CMP14+EGFR; Future -     CBC with Differential; Future     Follow Up Instructions: Labs   I discussed the assessment and treatment plan with the patient. The patient was provided an opportunity to ask questions and all were answered. The patient agreed with the plan and demonstrated an understanding of the instructions.   The patient was advised to call back  or seek an in-person evaluation if the symptoms worsen or if the condition fails to improve as anticipated.     I provided 20 minutes total time during this encounter including median intraservice time, reviewing previous notes, investigations, ordering medications, medical decision making, coordinating care and patient verbalized understanding at the end of the visit.    This note has been created with Education officer, environmental. Any transcriptional errors are unintentional.   Marius Siemens, NP 10/19/2023, 9:06 AM

## 2023-10-23 MED ORDER — CARVEDILOL 12.5 MG PO TABS: 12.5000 mg | ORAL_TABLET | Freq: Two times a day (BID) | ORAL | 1 refills | Status: DC

## 2023-10-23 MED ORDER — VALSARTAN-HYDROCHLOROTHIAZIDE 160-12.5 MG PO TABS: 1.0000 | ORAL_TABLET | Freq: Every day | ORAL | 1 refills | Status: DC

## 2023-10-25 ENCOUNTER — Ambulatory Visit: Attending: Critical Care Medicine

## 2023-10-25 DIAGNOSIS — Z79899 Other long term (current) drug therapy: Secondary | ICD-10-CM

## 2023-10-25 DIAGNOSIS — E782 Mixed hyperlipidemia: Secondary | ICD-10-CM

## 2023-10-25 DIAGNOSIS — I10 Essential (primary) hypertension: Secondary | ICD-10-CM

## 2023-10-26 LAB — CBC WITH DIFFERENTIAL/PLATELET
Basophils Absolute: 0.1 10*3/uL (ref 0.0–0.2)
Basos: 1 %
EOS (ABSOLUTE): 0.2 10*3/uL (ref 0.0–0.4)
Eos: 2 %
Hematocrit: 42.2 % (ref 37.5–51.0)
Hemoglobin: 14 g/dL (ref 13.0–17.7)
Immature Grans (Abs): 0.1 10*3/uL (ref 0.0–0.1)
Immature Granulocytes: 1 %
Lymphocytes Absolute: 4 10*3/uL — ABNORMAL HIGH (ref 0.7–3.1)
Lymphs: 38 %
MCH: 32 pg (ref 26.6–33.0)
MCHC: 33.2 g/dL (ref 31.5–35.7)
MCV: 96 fL (ref 79–97)
Monocytes Absolute: 0.8 10*3/uL (ref 0.1–0.9)
Monocytes: 7 %
Neutrophils Absolute: 5.4 10*3/uL (ref 1.4–7.0)
Neutrophils: 51 %
Platelets: 351 10*3/uL (ref 150–450)
RBC: 4.38 x10E6/uL (ref 4.14–5.80)
RDW: 12.7 % (ref 11.6–15.4)
WBC: 10.5 10*3/uL (ref 3.4–10.8)

## 2023-10-26 LAB — CMP14+EGFR
ALT: 24 IU/L (ref 0–44)
AST: 21 IU/L (ref 0–40)
Albumin: 5 g/dL (ref 4.1–5.1)
Alkaline Phosphatase: 58 IU/L (ref 44–121)
BUN/Creatinine Ratio: 20 (ref 9–20)
BUN: 39 mg/dL — ABNORMAL HIGH (ref 6–20)
Bilirubin Total: <0.2 mg/dL (ref 0.0–1.2)
CO2: 26 mmol/L (ref 20–29)
Calcium: 10.3 mg/dL — ABNORMAL HIGH (ref 8.7–10.2)
Chloride: 94 mmol/L — ABNORMAL LOW (ref 96–106)
Creatinine, Ser: 1.92 mg/dL — ABNORMAL HIGH (ref 0.76–1.27)
Globulin, Total: 2.3 g/dL (ref 1.5–4.5)
Glucose: 106 mg/dL — ABNORMAL HIGH (ref 70–99)
Potassium: 3.9 mmol/L (ref 3.5–5.2)
Sodium: 139 mmol/L (ref 134–144)
Total Protein: 7.3 g/dL (ref 6.0–8.5)
eGFR: 45 mL/min/{1.73_m2} — ABNORMAL LOW (ref >59)

## 2023-10-26 LAB — LIPID PANEL
Chol/HDL Ratio: 5.3 ratio — ABNORMAL HIGH (ref 0.0–5.0)
Cholesterol, Total: 196 mg/dL (ref 100–199)
HDL: 37 mg/dL — ABNORMAL LOW (ref >39)
LDL Chol Calc (NIH): 102 mg/dL — ABNORMAL HIGH (ref 0–99)
Triglycerides: 337 mg/dL — ABNORMAL HIGH (ref 0–149)
VLDL Cholesterol Cal: 57 mg/dL — ABNORMAL HIGH (ref 5–40)

## 2023-10-27 ENCOUNTER — Telehealth: Payer: Self-pay

## 2023-10-27 NOTE — Telephone Encounter (Unsigned)
 Copied from CRM [id number]. Topic: Clinical - Lab/Test Results >> Oct 26, 2023 11:58 AM Rennis Case wrote: Reason for CRM: Pt saw lab results on mychart. Requesting provider's interpretation, pt states he is needing rx refill cyclobenzaprine , and were needing labs to be completed to continue refills. Pt would also like to make provider aware that he takes Tylenol  & ibuprofen one time daily and that may explain the numbers for his liver function.   Best contact number: [phone]

## 2023-10-30 ENCOUNTER — Ambulatory Visit (INDEPENDENT_AMBULATORY_CARE_PROVIDER_SITE_OTHER): Payer: Self-pay | Admitting: Primary Care

## 2023-10-30 MED ORDER — ROSUVASTATIN CALCIUM 40 MG PO TABS: 40.0000 mg | ORAL_TABLET | Freq: Every day | ORAL | 1 refills | Status: DC

## 2023-10-30 NOTE — Telephone Encounter (Signed)
 Will forward to provider in regards to tylenol  and ibuprofen

## 2023-11-22 ENCOUNTER — Other Ambulatory Visit (HOSPITAL_COMMUNITY): Payer: Self-pay

## 2023-11-28 ENCOUNTER — Other Ambulatory Visit (HOSPITAL_COMMUNITY): Payer: Self-pay | Admitting: Physician Assistant

## 2023-11-28 DIAGNOSIS — F319 Bipolar disorder, unspecified: Secondary | ICD-10-CM

## 2023-12-22 ENCOUNTER — Other Ambulatory Visit: Payer: Self-pay | Admitting: Critical Care Medicine

## 2023-12-22 DIAGNOSIS — E782 Mixed hyperlipidemia: Secondary | ICD-10-CM

## 2024-01-10 ENCOUNTER — Encounter (HOSPITAL_COMMUNITY): Payer: Self-pay

## 2024-01-10 ENCOUNTER — Telehealth (HOSPITAL_COMMUNITY): Admitting: Physician Assistant

## 2024-01-23 ENCOUNTER — Other Ambulatory Visit (HOSPITAL_COMMUNITY): Payer: Self-pay | Admitting: Physician Assistant

## 2024-01-23 DIAGNOSIS — F319 Bipolar disorder, unspecified: Secondary | ICD-10-CM

## 2024-01-31 NOTE — Progress Notes (Unsigned)
 New Patient Office Visit  Subjective    Patient ID: Corey Smith, male    DOB: 1985-12-04  Age: 38 y.o. MRN: [id number]  CC:  No chief complaint on file.   HPI 12/14/21 Corey Smith presents to establish care. He was last seen by a primary care provider 5 years ago. He has history of anxiety, major depressive disorder, bipolar 1 disorder, history of opioid abuse and low back pain with sciatica. 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   Patient sees [facility] regularly and they manage his mental health medications. Currently he is on: Buspirone  15 mg TID, Trileptal  600 mg BID, Trazodone  100 mg daily and Effexor  150 mg daily. Reports he is satisfied with his medication regimen and feels well controlled.  Next appointment is 12/21/2021.   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 Methadone was stopped about 4 years ago. Last heroine use about 3 years ago.   He has a long standing history of lower back pain with sciatica pains. Pain affects both legs equally. Reports a prior MRI years ago. Also states he was on gabapentin  and flexeril  at one time and they substantially improved his symptoms.   Reports he has gained a considerable amount of weight. Today he is at 250 lbs, no previous weight on file. However, reports he was 180 lbs about 3 years ago. He is interested in weight loss tips.   He is concerned his blood pressure is high. Today's blood pressure is 139/85. Reports when he checks it himself, the numbers are usually right around that too.  He does smoke cigarettes, about 1 pack a day.  Lastly, he reports he has an umbilical hernia that has been present for years. His father also had one. It does not cause him pain, only mild discomfort while lifting/certain positions, and reduces when he pushes on it.   He does not have insurance at this time, however he did obtain orange card application today.    05/31/22 The  patient is seen for follow-up on arrival blood pressure elevated to 138/97 and he has not been taking his blood pressure medications.  He thought they were making her drowsy.  Note he is also on atorvastatin  which may have been interacting with his mental health medications causing drowsiness.  He has no other complaints Patient does see mental health been able to stop smoking.  He has lots of mental health issues which contribute to his continued smoking use.  8/9 The patient was seen in return follow-up he did not get his valsartan  filled blood pressure today is elevated 148/107.  He still smoking a pack a day of cigarettes.  He does need screening with HIV and hepatitis C.  He does not have a blood pressure meter at home.   02/01/24  Outpatient Encounter Medications as of 02/01/2024  Medication Sig   buPROPion  (WELLBUTRIN  XL) 150 MG 24 hr tablet Take 1 tablet (150 mg total) by mouth every morning.   carvedilol  (COREG ) 12.5 MG tablet Take 1 tablet (12.5 mg total) by mouth 2 (two) times daily with a meal.   cyclobenzaprine  (FLEXERIL ) 10 MG tablet TAKE 1 TABLET BY MOUTH THREE TIMES A DAY (NEED APPOINTMENT FOR FURTHER REFILLS)   fenofibrate  (TRICOR ) 145 MG tablet Take 1 tablet (145 mg total) by mouth daily.   gabapentin  (NEURONTIN ) 400 MG capsule Take 1 capsule (400 mg total) by mouth 3 (three) times daily.  hydrOXYzine  (ATARAX ) 25 MG tablet Take 1 tablet (25 mg total) by mouth 3 (three) times daily as needed.   oxcarbazepine  (TRILEPTAL ) 600 MG tablet Take 1 tablet (600 mg total) by mouth 2 (two) times daily.   rosuvastatin  (CRESTOR ) 40 MG tablet Take 1 tablet (40 mg total) by mouth daily.   traZODone  (DESYREL ) 100 MG tablet Take 2 tablets (200 mg total) by mouth at bedtime.   valsartan -hydrochlorothiazide  (DIOVAN -HCT) 160-12.5 MG tablet Take 1 tablet by mouth daily.   venlafaxine  XR (EFFEXOR -XR) 150 MG 24 hr capsule Take 1 capsule (150 mg total) by mouth at bedtime.   venlafaxine  XR (EFFEXOR -XR)  75 MG 24 hr capsule Take 1 capsule (75 mg total) by mouth daily.   No facility-administered encounter medications on file as of 02/01/2024.    Past Medical History:  Diagnosis Date   ANXIETY 06/16/2010   Bipolar 1 disorder (HCC) 2018   Bipolar disorder (HCC)    FIBROMYALGIA 06/16/2010   HYPERLIPIDEMIA 06/16/2010   HYPERTRIGLYCERIDEMIA 06/16/2010   Impaired glucose tolerance 07/14/2011   SCOLIOSIS, MILD 02/16/2007   WISDOM TEETH EXTRACTION, HX OF 02/16/2007    Past Surgical History:  Procedure Laterality Date   CORNEAL TRANSPLANT Left 2019   WISDOM TOOTH EXTRACTION Bilateral 2003    Family History  Problem Relation Age of Onset   Breast cancer Mother    Hypertension Father    High Cholesterol Father    Depression Brother    Anxiety disorder Brother    Autism spectrum disorder Son    Hypertension Other    Anxiety disorder Other     Social History   Socioeconomic History   Marital status: Divorced    Spouse name: Not on file   Number of children: Not on file   Years of education: Not on file   Highest education level: Not on file  Occupational History   Not on file  Tobacco Use   Smoking status: Every Day    Current packs/day: 1.00    Average packs/day: 1 pack/day for 22.7 years (22.7 ttl pk-yrs)    Types: Cigarettes    Start date: 2003   Smokeless tobacco: Never  Vaping Use   Vaping status: Never Used  Substance and Sexual Activity   Alcohol use: Not Currently    Comment: denies   Drug use: Not Currently    Comment: last use illicit substances 06/13/2019   Sexual activity: Yes    Partners: Female    Comment: Girlfriend is on oral contraception  Other Topics Concern   Not on file  Social History Narrative   Not on file   Social Drivers of Health   Financial Resource Strain: Not on file  Food Insecurity: Not on file  Transportation Needs: Not on file  Physical Activity: Not on file  Stress: Not on file  Social Connections: Not on file  Intimate  Partner Violence: Not on file    Review of Systems  Constitutional: Negative.  Negative for chills, diaphoresis, fever, malaise/fatigue and weight loss.       Weight gain over 3 yrs 180>>>250#  HENT: Negative.  Negative for congestion, hearing loss, nosebleeds, sore throat and tinnitus.   Eyes: Negative.  Negative for blurred vision, photophobia and redness.  Respiratory:  Negative for cough, hemoptysis, sputum production, shortness of breath, wheezing and stridor.   Cardiovascular: Negative.  Negative for chest pain, palpitations, orthopnea, claudication, leg swelling and PND.  Gastrointestinal: Negative.  Negative for abdominal pain, blood in stool, constipation, diarrhea, heartburn,  nausea and vomiting.  Genitourinary:  Negative for dysuria, flank pain, frequency, hematuria and urgency.  Musculoskeletal:  Positive for back pain. Negative for falls, joint pain, myalgias and neck pain.       Right > left sciatica  Skin: Negative.  Negative for itching and rash.  Neurological:  Negative for dizziness, tingling, tremors, sensory change, speech change, focal weakness, seizures, loss of consciousness, weakness and headaches.  Endo/Heme/Allergies:  Negative for environmental allergies and polydipsia. Does not bruise/bleed easily.  Psychiatric/Behavioral: Negative.  Negative for depression, hallucinations, memory loss, substance abuse and suicidal ideas. The patient is not nervous/anxious and does not have insomnia.         Objective    There were no vitals taken for this visit.  Physical Exam Constitutional:      Appearance: He is obese.  HENT:     Right Ear: Tympanic membrane, ear canal and external ear normal.     Left Ear: Tympanic membrane, ear canal and external ear normal.     Nose: Nose normal.     Mouth/Throat:     Mouth: Mucous membranes are moist.     Pharynx: Oropharynx is clear.  Eyes:     Conjunctiva/sclera: Conjunctivae normal.  Cardiovascular:     Rate and Rhythm:  Normal rate and regular rhythm.     Pulses: Normal pulses.     Heart sounds: Normal heart sounds.  Pulmonary:     Effort: Pulmonary effort is normal.     Breath sounds: Normal breath sounds.  Abdominal:     General: Bowel sounds are normal.     Palpations: Abdomen is soft.     Comments: Small umbilical hernia present, easily reducible   Musculoskeletal:     Cervical back: Normal range of motion.  Skin:    General: Skin is warm.     Capillary Refill: Capillary refill takes less than 2 seconds.  Neurological:     General: No focal deficit present.     Mental Status: He is alert and oriented to person, place, and time.     Sensory: No sensory deficit.     Motor: No weakness.     Gait: Gait normal.  Psychiatric:        Mood and Affect: Mood normal.        Behavior: Behavior normal.          Assessment & Plan:   Problem List Items Addressed This Visit   None  Will check HIV hep C for population health screening Needs short-term follow-up on hypertension No follow-ups on file.   Belvie Silvan, MD

## 2024-02-01 ENCOUNTER — Ambulatory Visit: Payer: Self-pay | Attending: Critical Care Medicine | Admitting: Critical Care Medicine

## 2024-02-01 ENCOUNTER — Encounter: Payer: Self-pay | Admitting: Critical Care Medicine

## 2024-02-01 VITALS — BP 129/83 | HR 104 | Ht 67.0 in | Wt 211.4 lb

## 2024-02-01 DIAGNOSIS — F17210 Nicotine dependence, cigarettes, uncomplicated: Secondary | ICD-10-CM | POA: Diagnosis not present

## 2024-02-01 DIAGNOSIS — R000 Tachycardia, unspecified: Secondary | ICD-10-CM | POA: Insufficient documentation

## 2024-02-01 DIAGNOSIS — Z720 Tobacco use: Secondary | ICD-10-CM

## 2024-02-01 DIAGNOSIS — N1831 Chronic kidney disease, stage 3a: Secondary | ICD-10-CM | POA: Diagnosis not present

## 2024-02-01 DIAGNOSIS — E782 Mixed hyperlipidemia: Secondary | ICD-10-CM

## 2024-02-01 DIAGNOSIS — Z8249 Family history of ischemic heart disease and other diseases of the circulatory system: Secondary | ICD-10-CM | POA: Insufficient documentation

## 2024-02-01 DIAGNOSIS — Z79899 Other long term (current) drug therapy: Secondary | ICD-10-CM | POA: Diagnosis not present

## 2024-02-01 DIAGNOSIS — K429 Umbilical hernia without obstruction or gangrene: Secondary | ICD-10-CM | POA: Diagnosis not present

## 2024-02-01 DIAGNOSIS — E785 Hyperlipidemia, unspecified: Secondary | ICD-10-CM | POA: Diagnosis not present

## 2024-02-01 DIAGNOSIS — G8929 Other chronic pain: Secondary | ICD-10-CM | POA: Diagnosis not present

## 2024-02-01 DIAGNOSIS — F319 Bipolar disorder, unspecified: Secondary | ICD-10-CM | POA: Insufficient documentation

## 2024-02-01 DIAGNOSIS — M5442 Lumbago with sciatica, left side: Secondary | ICD-10-CM | POA: Diagnosis not present

## 2024-02-01 DIAGNOSIS — I10 Essential (primary) hypertension: Secondary | ICD-10-CM | POA: Insufficient documentation

## 2024-02-01 DIAGNOSIS — M5441 Lumbago with sciatica, right side: Secondary | ICD-10-CM | POA: Diagnosis not present

## 2024-02-01 MED ORDER — CYCLOBENZAPRINE HCL 10 MG PO TABS: 10.0000 mg | ORAL_TABLET | Freq: Three times a day (TID) | ORAL | 0 refills | Status: DC | PRN

## 2024-02-01 MED ORDER — AMLODIPINE BESYLATE 10 MG PO TABS: 10.0000 mg | ORAL_TABLET | Freq: Every day | ORAL | 1 refills | Status: AC

## 2024-02-01 MED ORDER — FENOFIBRATE 145 MG PO TABS: 145.0000 mg | ORAL_TABLET | Freq: Every day | ORAL | 3 refills | Status: AC

## 2024-02-01 MED ORDER — CARVEDILOL 12.5 MG PO TABS: 12.5000 mg | ORAL_TABLET | Freq: Two times a day (BID) | ORAL | 1 refills | Status: AC

## 2024-02-01 MED ORDER — HYDROCHLOROTHIAZIDE 25 MG PO TABS: 25.0000 mg | ORAL_TABLET | Freq: Every day | ORAL | 3 refills | Status: AC

## 2024-02-01 MED ORDER — EZETIMIBE 10 MG PO TABS: 10.0000 mg | ORAL_TABLET | Freq: Every day | ORAL | 3 refills | Status: AC

## 2024-02-01 MED ORDER — ROSUVASTATIN CALCIUM 40 MG PO TABS: 40.0000 mg | ORAL_TABLET | Freq: Every day | ORAL | 1 refills | Status: AC

## 2024-02-01 NOTE — Patient Instructions (Signed)
 Lab today to check kidney function  Stop valsartan  HCT and begin amlodipine  1 daily and hydrochlorothiazide  1 daily these were sent to your pharmacy  Add ezetimibe  1 daily for cholesterol and stay on the fenofibrate  and the rosuvastatin  all 3 for cholesterol  Refills on all other medications that I am caring for were made  Return 4 months for recheck I will ask for this to be not a video visit  Keep yourself well-hydrated  See back exercises  For back pain do not use ibuprofen Aleve  or other types of medications that will make your kidneys worse instead use acetaminophen  Tylenol  2 extra strength 4 times a day as needed for the back pain and follow the back exercises

## 2024-02-02 ENCOUNTER — Encounter: Payer: Self-pay | Admitting: Critical Care Medicine

## 2024-02-02 ENCOUNTER — Ambulatory Visit: Payer: Self-pay | Admitting: Critical Care Medicine

## 2024-02-02 DIAGNOSIS — N1831 Chronic kidney disease, stage 3a: Secondary | ICD-10-CM | POA: Insufficient documentation

## 2024-02-02 LAB — BASIC METABOLIC PANEL WITH GFR
BUN/Creatinine Ratio: 21 — ABNORMAL HIGH (ref 9–20)
BUN: 17 mg/dL (ref 6–20)
CO2: 24 mmol/L (ref 20–29)
Calcium: 9.8 mg/dL (ref 8.7–10.2)
Chloride: 100 mmol/L (ref 96–106)
Creatinine, Ser: 0.81 mg/dL (ref 0.76–1.27)
Glucose: 72 mg/dL (ref 70–99)
Potassium: 4.3 mmol/L (ref 3.5–5.2)
Sodium: 139 mmol/L (ref 134–144)
eGFR: 115 mL/min/1.73 (ref >59)

## 2024-02-02 NOTE — Assessment & Plan Note (Signed)
 Plan to add ezetimibe  to fenofibrate  and atorvastatin  will follow-up next visit in person with another lipid panel

## 2024-02-02 NOTE — Assessment & Plan Note (Signed)
 Stable under management by psychiatry

## 2024-02-02 NOTE — Assessment & Plan Note (Signed)
 This was assessed and diagnosed in June today creatinine is normal this problem has resolved encourage patient to continue to hydrate on his job he works at [employer]

## 2024-02-02 NOTE — Progress Notes (Signed)
 Let patient know kidney function has returned to normal continue with medication changes we discussed make sure he stays well-hydrated

## 2024-02-02 NOTE — Assessment & Plan Note (Signed)
Nicotine lozange and nicotine patch sent to pharmacy downstairs Diet and lifestyle handout reviewed in depth with patient today     Current smoking consumption amount: 1PPD  Dicsussion on advise to quit smoking and smoking impacts: cv impact lungs  Patient's willingness to quit:  Wants to quit  Methods to quit smoking discussed:  behav mod.  Did not have success with nicotine or Wellbutrin cannot Chantix due to mental health  Medication managem take oking session drugs discussed: None available   Quit date not established Follow-up arranged 6 weeks   Time spent counseling the patient:  5 min

## 2024-02-02 NOTE — Assessment & Plan Note (Signed)
 Plan is to continue chlorthalidone but switch valsartan  to low-dose amlodipine  note recheck on labs today show normal creatinine and normal potassium

## 2024-02-21 ENCOUNTER — Other Ambulatory Visit (HOSPITAL_COMMUNITY): Payer: Self-pay | Admitting: Psychiatry

## 2024-02-21 ENCOUNTER — Telehealth (HOSPITAL_COMMUNITY): Payer: Self-pay | Admitting: Psychiatry

## 2024-02-21 ENCOUNTER — Other Ambulatory Visit (HOSPITAL_COMMUNITY): Payer: Self-pay | Admitting: Physician Assistant

## 2024-02-21 DIAGNOSIS — F319 Bipolar disorder, unspecified: Secondary | ICD-10-CM

## 2024-02-21 MED ORDER — GABAPENTIN 400 MG PO CAPS: 400.0000 mg | ORAL_CAPSULE | Freq: Three times a day (TID) | ORAL | 3 refills | Status: AC

## 2024-02-21 MED ORDER — BUPROPION HCL ER (XL) 150 MG PO TB24: 150.0000 mg | ORAL_TABLET | ORAL | 3 refills | Status: AC

## 2024-02-21 MED ORDER — OXCARBAZEPINE 600 MG PO TABS: 600.0000 mg | ORAL_TABLET | Freq: Two times a day (BID) | ORAL | 3 refills | Status: AC

## 2024-02-21 MED ORDER — TRAZODONE HCL 100 MG PO TABS
200.0000 mg | ORAL_TABLET | Freq: Every evening | ORAL | 3 refills | Status: AC
Start: 2024-02-21 — End: 2024-06-20

## 2024-02-21 NOTE — Telephone Encounter (Signed)
 Refill request:  This patient called in to make an appointment. It was noted that he is in [place] and was to be referred to [facility] resources. Patient mentioned is almost out of Gabapentin  and needs a refill while finding a new provider. His current provider is Eddie. Please advise. Thank you.

## 2024-02-21 NOTE — Telephone Encounter (Signed)
 Medication sent to preferred pharmacy

## 2024-02-26 ENCOUNTER — Telehealth: Payer: Self-pay | Admitting: Critical Care Medicine

## 2024-02-26 NOTE — Telephone Encounter (Signed)
 Copied from CRM [id number]. Topic: Referral - Request for Referral >> Feb 21, 2024  2:53 PM Corey Smith ORN wrote: Did the patient discuss referral with their provider in the last year? Yes   Appointment offered? Yes  Type of order/referral and detailed reason for visit: pt was told he would have to switch behavioral health dr will need a referral . He is in need of an rx from [facility]orial place  Preference of office, provider, location: [address] [place], [place] [address] Phone: [phone]  //[phone]   If referral order, have you been seen by this specialty before? Yes (If Yes, this issue or another issue? When? Where? [facility] in June  Can we respond through MyChart? No

## 2024-06-04 ENCOUNTER — Telehealth: Payer: Self-pay | Admitting: Critical Care Medicine

## 2024-06-04 ENCOUNTER — Ambulatory Visit: Payer: Self-pay | Admitting: Family Medicine

## 2024-06-04 NOTE — Telephone Encounter (Signed)
 Copied from CRM [id number]. Topic: Clinical - Medication Refill >> Jun 04, 2024 10:44 AM Maisie C wrote: Medication: pt states he needs all his meds that are prescribe by Dr. Brien refilled  Has the patient contacted their pharmacy? No (Agent: If no, request that the patient contact the pharmacy for the refill. If patient does not wish to contact the pharmacy document the reason why and proceed with request.) (Agent: If yes, when and what did the pharmacy advise?)  This is the patient's preferred pharmacy:  [facility], [place] - [address] [place] [address] Phone: [phone] Fax: [phone]  Is this the correct pharmacy for this prescription? Yes If no, delete pharmacy and type the correct one.   Has the prescription been filled recently? No  Is the patient out of the medication? No  Has the patient been seen for an appointment in the last year OR does the patient have an upcoming appointment? No  Can we respond through MyChart? No  Agent: Please be advised that Rx refills may take up to 3 business days. We ask that you follow-up with your pharmacy.

## 2024-06-13 ENCOUNTER — Ambulatory Visit: Payer: MEDICAID | Admitting: Internal Medicine

## 2024-06-13 ENCOUNTER — Other Ambulatory Visit: Payer: Self-pay | Admitting: Critical Care Medicine

## 2024-06-13 ENCOUNTER — Ambulatory Visit: Payer: Self-pay | Admitting: Internal Medicine

## 2024-06-13 DIAGNOSIS — F319 Bipolar disorder, unspecified: Secondary | ICD-10-CM

## 2024-06-13 NOTE — Telephone Encounter (Unsigned)
 Copied from CRM [id number]. Topic: Clinical - Medication Refill >> Jun 13, 2024 11:36 AM Victoria B wrote: Medication: traZODone  (DESYREL ) 100 MG tablet/cyclobenzaprine  (FLEXERIL ) 10 MG tablet  gabapentin  (NEURONTIN ) 400/ buPROPion  (WELLBUTRIN  XL) 150 MG 24 hr tablet/ Patient asking for the refills to hold him until his appt if he has to wait  Has the patient contacted their pharmacy? yes (Agent: If no, request that the patient contact the pharmacy for the refill. If patient does not wish to contact the pharmacy document the reason why and proceed with request.) (Agent: If yes, when and what did the pharmacy advise?)contact pcp  This is the patient's preferred pharmacy:  [facility], [place] - [address] [place] [address] Phone: [phone] Fax: [phone]   Is this the correct pharmacy for this prescription? yes If no, delete pharmacy and type the correct one.   Has the prescription been filled recently? no  Is the patient out of the medication? yes  Has the patient been seen for an appointment in the last year OR does the patient have an upcoming appointment? yes  Can we respond through MyChart? no  Agent: Please be advised that Rx refills may take up to 3 business days. We ask that you follow-up with your pharmacy.

## 2024-06-14 MED ORDER — CYCLOBENZAPRINE HCL 10 MG PO TABS: 10.0000 mg | ORAL_TABLET | Freq: Three times a day (TID) | ORAL | 0 refills | Status: AC | PRN

## 2024-06-14 NOTE — Telephone Encounter (Signed)
 Requested medication (s) are due for refill today: yes  Requested medication (s) are on the active medication list: yes  Last refill:  02/21/24  Future visit scheduled: yes  Notes to clinic:  Unable to refill per protocol, last refill by another provider.      Requested Prescriptions  Pending Prescriptions Disp Refills   buPROPion  (WELLBUTRIN  XL) 150 MG 24 hr tablet 30 tablet 3    Sig: Take 1 tablet (150 mg total) by mouth every morning.     Psychiatry: Antidepressants - bupropion  Passed - 06/14/2024  3:08 PM      Passed - Cr in normal range and within 360 days    Creatinine, Ser  Date Value Ref Range Status  02/01/2024 0.81 0.76 - 1.27 mg/dL Final         Passed - AST in normal range and within 360 days    AST  Date Value Ref Range Status  10/25/2023 21 0 - 40 IU/L Final         Passed - ALT in normal range and within 360 days    ALT  Date Value Ref Range Status  10/25/2023 24 0 - 44 IU/L Final         Passed - Last BP in normal range    BP Readings from Last 1 Encounters:  02/01/24 129/83         Passed - Valid encounter within last 6 months    Recent Outpatient Visits           4 months ago Primary hypertension   [facility] [place]. [facility] Brien Belvie BRAVO, MD   7 months ago Mixed hyperlipidemia   [facility] Celestia Rosaline SQUIBB, NP   1 year ago Decreased libido   [facility] [place]. [facility], Jon HERO, [place]   1 year ago Primary hypertension   [facility] [place]. [facility] Brien Belvie BRAVO, MD   2 years ago Primary hypertension   [facility] [place]. [facility] Brien Belvie BRAVO, MD               traZODone  (DESYREL ) 100 MG tablet 60 tablet 3    Sig: Take 2 tablets (200 mg total) by mouth at bedtime.     Psychiatry: Antidepressants  - Serotonin Modulator Passed - 06/14/2024  3:08 PM      Passed - Valid encounter within last 6 months    Recent Outpatient Visits           4 months ago Primary hypertension   [facility] [place]. [facility] Brien Belvie BRAVO, MD   7 months ago Mixed hyperlipidemia   [place] Celestia Rosaline SQUIBB, NP   1 year ago Decreased libido   [facility] [place]. [facility] [place], Jon HERO, [place]   1 year ago Primary hypertension   [facility] [place]. [facility] Brien Belvie BRAVO, MD   2 years ago Primary hypertension   [facility] Of [place]. [facility] Brien Belvie BRAVO, MD  cyclobenzaprine  (FLEXERIL ) 10 MG tablet 60 tablet 0    Sig: Take 1 tablet (10 mg total) by mouth 3 (three) times daily as needed for muscle spasms.     Not Delegated - Analgesics:  Muscle Relaxants Failed - 06/14/2024  3:08 PM      Failed - This refill cannot be delegated      Passed - Valid encounter within last 6 months    Recent Outpatient Visits           4 months ago Primary hypertension   [facility] [place]. [facility] Brien Belvie BRAVO, MD   7 months ago Mixed hyperlipidemia   [place] [facility] Celestia Rosaline SQUIBB, NP   1 year ago Decreased libido   [facility] [place]. [facility] [place], Jon HERO, [place]   1 year ago Primary hypertension   [facility] Of [place]. [facility] Brien Belvie BRAVO, MD   2 years ago Primary hypertension   [facility] Of [place]. [facility] Brien Belvie BRAVO, MD               gabapentin  (NEURONTIN ) 400 MG capsule 90 capsule 3    Sig: Take 1 capsule (400 mg total) by mouth  3 (three) times daily.     Neurology: Anticonvulsants - gabapentin  Passed - 06/14/2024  3:08 PM      Passed - Cr in normal range and within 360 days    Creatinine, Ser  Date Value Ref Range Status  02/01/2024 0.81 0.76 - 1.27 mg/dL Final         Passed - Completed PHQ-2 or PHQ-9 in the last 360 days      Passed - Valid encounter within last 12 months    Recent Outpatient Visits           4 months ago Primary hypertension   [facility] [place]. [facility] Brien Belvie BRAVO, MD   7 months ago Mixed hyperlipidemia   [place] Celestia Rosaline SQUIBB, NP   1 year ago Decreased libido   [facility] [facility] [place]. [facility] [place], Jon HERO, [place]   1 year ago Primary hypertension   [facility] [place]. [facility] Brien Belvie BRAVO, MD   2 years ago Primary hypertension   [facility] Of [employer]. [facility] Brien Belvie BRAVO, MD

## 2024-06-24 ENCOUNTER — Ambulatory Visit: Payer: Self-pay | Admitting: Critical Care Medicine
